# Patient Record
Sex: Female | Born: 1980 | Race: White | Hispanic: No | Marital: Married | Smoking: Never smoker
Health system: Southern US, Community
[De-identification: ages and names within clinical notes are randomized; demographics above are authoritative.]

## PROBLEM LIST (undated history)

## (undated) DIAGNOSIS — M1990 Unspecified osteoarthritis, unspecified site: Secondary | ICD-10-CM

## (undated) DIAGNOSIS — F418 Other specified anxiety disorders: Secondary | ICD-10-CM

## (undated) DIAGNOSIS — N200 Calculus of kidney: Secondary | ICD-10-CM

## (undated) DIAGNOSIS — R531 Weakness: Secondary | ICD-10-CM

## (undated) DIAGNOSIS — F988 Other specified behavioral and emotional disorders with onset usually occurring in childhood and adolescence: Secondary | ICD-10-CM

## (undated) DIAGNOSIS — E890 Postprocedural hypothyroidism: Secondary | ICD-10-CM

## (undated) DIAGNOSIS — N83209 Unspecified ovarian cyst, unspecified side: Secondary | ICD-10-CM

## (undated) DIAGNOSIS — F419 Anxiety disorder, unspecified: Secondary | ICD-10-CM

## (undated) DIAGNOSIS — C73 Malignant neoplasm of thyroid gland: Secondary | ICD-10-CM

## (undated) HISTORY — PX: AUGMENTATION MAMMAPLASTY: SUR837

## (undated) HISTORY — DX: Unspecified osteoarthritis, unspecified site: M19.90

## (undated) HISTORY — DX: Other specified behavioral and emotional disorders with onset usually occurring in childhood and adolescence: F98.8

## (undated) HISTORY — DX: Postprocedural hypothyroidism: E89.0

## (undated) HISTORY — PX: TOTAL THYROIDECTOMY: SHX2547

## (undated) HISTORY — DX: Calculus of kidney: N20.0

## (undated) HISTORY — PX: REDUCTION MAMMAPLASTY: SUR839

## (undated) HISTORY — DX: Weakness: R53.1

## (undated) HISTORY — DX: Unspecified ovarian cyst, unspecified side: N83.209

## (undated) HISTORY — PX: BREAST BIOPSY: SHX20

## (undated) HISTORY — DX: Malignant neoplasm of thyroid gland: C73

## (undated) HISTORY — DX: Other specified anxiety disorders: F41.8

---
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
## 2009-05-29 HISTORY — PX: UMBILICAL HERNIA REPAIR: SHX196

## 2011-02-21 DIAGNOSIS — O09899 Supervision of other high risk pregnancies, unspecified trimester: Secondary | ICD-10-CM

## 2011-02-21 HISTORY — DX: Supervision of other high risk pregnancies, unspecified trimester: O09.899

## 2011-03-31 DIAGNOSIS — F419 Anxiety disorder, unspecified: Secondary | ICD-10-CM | POA: Insufficient documentation

## 2014-12-30 DIAGNOSIS — R531 Weakness: Secondary | ICD-10-CM | POA: Insufficient documentation

## 2016-05-29 HISTORY — PX: BREAST ENHANCEMENT SURGERY: SHX7

## 2016-09-14 DIAGNOSIS — F988 Other specified behavioral and emotional disorders with onset usually occurring in childhood and adolescence: Secondary | ICD-10-CM | POA: Insufficient documentation

## 2017-05-29 DIAGNOSIS — G500 Trigeminal neuralgia: Secondary | ICD-10-CM

## 2017-05-29 HISTORY — DX: Trigeminal neuralgia: G50.0

## 2017-06-22 DIAGNOSIS — R200 Anesthesia of skin: Secondary | ICD-10-CM | POA: Insufficient documentation

## 2017-06-22 DIAGNOSIS — K219 Gastro-esophageal reflux disease without esophagitis: Secondary | ICD-10-CM | POA: Insufficient documentation

## 2017-06-27 DIAGNOSIS — M792 Neuralgia and neuritis, unspecified: Secondary | ICD-10-CM | POA: Insufficient documentation

## 2018-05-29 HISTORY — PX: APPENDECTOMY: SHX54

## 2018-06-06 LAB — TSH: TSH: 0.05 — AB (ref <=5.90)

## 2020-01-08 LAB — HM PAP SMEAR: HM Pap smear: NORMAL

## 2020-01-31 ENCOUNTER — Ambulatory Visit (HOSPITAL_COMMUNITY): Payer: Self-pay

## 2020-03-30 ENCOUNTER — Other Ambulatory Visit: Payer: Self-pay | Admitting: Family Medicine

## 2020-03-30 ENCOUNTER — Encounter: Payer: Self-pay | Admitting: Family Medicine

## 2020-03-30 ENCOUNTER — Other Ambulatory Visit: Payer: Self-pay

## 2020-03-30 ENCOUNTER — Ambulatory Visit (INDEPENDENT_AMBULATORY_CARE_PROVIDER_SITE_OTHER): Payer: 59 | Admitting: Family Medicine

## 2020-03-30 VITALS — BP 117/81 | HR 78 | Temp 98.0°F | Ht 61.0 in | Wt 139.0 lb

## 2020-03-30 DIAGNOSIS — Z23 Encounter for immunization: Secondary | ICD-10-CM | POA: Diagnosis not present

## 2020-03-30 DIAGNOSIS — F418 Other specified anxiety disorders: Secondary | ICD-10-CM | POA: Insufficient documentation

## 2020-03-30 DIAGNOSIS — C73 Malignant neoplasm of thyroid gland: Secondary | ICD-10-CM

## 2020-03-30 DIAGNOSIS — N200 Calculus of kidney: Secondary | ICD-10-CM

## 2020-03-30 DIAGNOSIS — E559 Vitamin D deficiency, unspecified: Secondary | ICD-10-CM | POA: Diagnosis not present

## 2020-03-30 DIAGNOSIS — M1990 Unspecified osteoarthritis, unspecified site: Secondary | ICD-10-CM

## 2020-03-30 DIAGNOSIS — R160 Hepatomegaly, not elsewhere classified: Secondary | ICD-10-CM

## 2020-03-30 DIAGNOSIS — Z79899 Other long term (current) drug therapy: Secondary | ICD-10-CM | POA: Diagnosis not present

## 2020-03-30 DIAGNOSIS — E890 Postprocedural hypothyroidism: Secondary | ICD-10-CM | POA: Diagnosis not present

## 2020-03-30 DIAGNOSIS — R109 Unspecified abdominal pain: Secondary | ICD-10-CM

## 2020-03-30 DIAGNOSIS — N83201 Unspecified ovarian cyst, right side: Secondary | ICD-10-CM

## 2020-03-30 DIAGNOSIS — Z0000 Encounter for general adult medical examination without abnormal findings: Secondary | ICD-10-CM

## 2020-03-30 HISTORY — DX: Calculus of kidney: N20.0

## 2020-03-30 LAB — COMPREHENSIVE METABOLIC PANEL
ALT: 17 U/L (ref 0–35)
AST: 20 U/L (ref 0–37)
Albumin: 4.1 g/dL (ref 3.5–5.2)
Alkaline Phosphatase: 64 U/L (ref 39–117)
BUN: 9 mg/dL (ref 6–23)
CO2: 29 mEq/L (ref 19–32)
Calcium: 9.1 mg/dL (ref 8.4–10.5)
Chloride: 100 mEq/L (ref 96–112)
Creatinine, Ser: 0.54 mg/dL (ref 0.40–1.20)
GFR: 116.38 mL/min (ref >60.00)
Glucose, Bld: 80 mg/dL (ref 70–99)
Potassium: 4.1 mEq/L (ref 3.5–5.1)
Sodium: 136 mEq/L (ref 135–145)
Total Bilirubin: 0.7 mg/dL (ref 0.2–1.2)
Total Protein: 6.8 g/dL (ref 6.0–8.3)

## 2020-03-30 LAB — CBC
HCT: 34.7 % — ABNORMAL LOW (ref 36.0–46.0)
Hemoglobin: 11.6 g/dL — ABNORMAL LOW (ref 12.0–15.0)
MCHC: 33.5 g/dL (ref 30.0–36.0)
MCV: 83.7 fl (ref 78.0–100.0)
Platelets: 416 10*3/uL — ABNORMAL HIGH (ref 150.0–400.0)
RBC: 4.14 Mil/uL (ref 3.87–5.11)
RDW: 14.4 % (ref 11.5–15.5)
WBC: 6.4 10*3/uL (ref 4.0–10.5)

## 2020-03-30 LAB — LIPID PANEL
Cholesterol: 214 mg/dL — ABNORMAL HIGH (ref 0–200)
HDL: 67.7 mg/dL (ref >39.00)
LDL Cholesterol: 124 mg/dL — ABNORMAL HIGH (ref 0–99)
NonHDL: 146.18
Total CHOL/HDL Ratio: 3
Triglycerides: 111 mg/dL (ref 0.0–149.0)
VLDL: 22.2 mg/dL (ref 0.0–40.0)

## 2020-03-30 LAB — VITAMIN D 25 HYDROXY (VIT D DEFICIENCY, FRACTURES): VITD: 24.71 ng/mL — ABNORMAL LOW (ref 30.00–100.00)

## 2020-03-30 LAB — T4, FREE: Free T4: 0.76 ng/dL (ref 0.60–1.60)

## 2020-03-30 LAB — T3, FREE: T3, Free: 3.4 pg/mL (ref 2.3–4.2)

## 2020-03-30 LAB — TSH: TSH: 2.01 u[IU]/mL (ref 0.35–4.50)

## 2020-03-30 MED ORDER — DULOXETINE HCL 60 MG PO CPEP
60.0000 mg | ORAL_CAPSULE | Freq: Every day | ORAL | 1 refills | Status: DC
Start: 2020-03-30 — End: 2020-10-04

## 2020-03-30 NOTE — Patient Instructions (Signed)
Pleasure to meet you today.  We will call you with lab results once available.   I will order that [place] after I we get the CT image to compare.    Please help [place] help you:  We are honored you have chosen [facility] for your [facility] home. Below you will find basic instructions that you may need to access in the future. Please help [place] help you by reading the instructions, which cover many of the frequent questions we experience.   Prescription refills and request:  -In order to allow more efficient response time, please call your pharmacy for all refills. They will forward the request electronically to [place]. This allows for the quickest possible response. Request left on a nurse line can take longer to refill, since these are checked as time allows between office patients and other phone calls.  - refill request can take up to 3-5 working days to complete.  - If request is sent electronically and request is appropiate, it is usually completed in 1-2 business days.  - all patients will need to be seen routinely for all chronic medical conditions requiring prescription medications (see follow-up below). If you are overdue for follow up on your condition, you will be asked to make an appointment and we will call in enough medication to cover you until your appointment (up to 30 days).  - all controlled substances will require a face to face visit to request/refill.  - if you desire your prescriptions to go through a new pharmacy, and have an active script at original pharmacy, you will need to call your pharmacy and have scripts transferred to new pharmacy. This is completed between the pharmacy locations and not by your provider.    Results: Our office handles many outgoing and incoming calls daily. If we have not contacted you within 1 week about your results, please check your mychart to see if there is a message first and if not, then contact our office.  In helping with this matter, you  help decrease call volume, and therefore allow [place] to be able to respond to patients needs more efficiently.  We will always attempt to call you with results,  normal or abnormal. However, if we are unable to reach you we will send a message in your my chart with results.   Acute office visits (sick visit):  An acute visit is intended for a new problem and are scheduled in shorter time slots to allow schedule openings for patients with new problems. This is the appropriate visit to discuss a new problem. Problems will not be addressed by phone call or Echart message. Appointment is needed if requesting treatment. In order to provide you with excellent quality medical care with proper time for you to explain your problem, have an exam and receive treatment with instructions, these appointments should be limited to one new problem per visit. If you experience a new problem, in which you desire to be addressed, please make an acute office visit, we save openings on the schedule to accommodate you. Please do not save your new problem for any other type of visit, let us take care of it properly and quickly for you.   Follow up visits:  Depending on your condition(s) your provider will need to see you routinely in order to provide you with quality care and prescribe medication(s). Most chronic conditions (Example: hypertension, Diabetes, depression/anxiety... etc), require visits a couple times a year. Your provider will instruct you  on proper follow up for your personal medical conditions and history. Please make certain to make follow up appointments for your condition as instructed. Failing to do so could result in lapse in your medication treatment/refills. If you request a refill, and are overdue to be seen on a condition, we will always provide you with a 30 day script (once) to allow you time to schedule.    Medicare wellness (well visit): - we have a wonderful Nurse Maudie Mercury), that will meet with you and  provide you will yearly medicare wellness visits. These visits should occur yearly (can not be scheduled less than 1 calendar year apart) and cover preventive health, immunizations, advance directives and screenings you are entitled to yearly through your medicare benefits. Do not miss out on your entitled benefits, this is when medicare will pay for these benefits to be ordered for you.  These are strongly encouraged by your provider and is the appropriate type of visit to make certain you are up to date with all preventive health benefits. If you have not had your medicare wellness exam in the last 12 months, please make certain to schedule one by calling the office and schedule your medicare wellness with Maudie Mercury as soon as possible.   Yearly physical (well visit):  - Adults are recommended to be seen yearly for physicals. Check with your insurance and date of your last physical, most insurances require one calendar year between physicals. Physicals include all preventive health topics, screenings, medical exam and labs that are appropriate for gender/age and history. You may have fasting labs needed at this visit. This is a well visit (not a sick visit), new problems should not be covered during this visit (see acute visit).  - Pediatric patients are seen more frequently when they are younger. Your provider will advise you on well child visit timing that is appropriate for your their age. - This is not a medicare wellness visit. Medicare wellness exams do not have an exam portion to the visit. Some medicare companies allow for a physical, some do not allow a yearly physical. If your medicare allows a yearly physical you can schedule the medicare wellness with our nurse Maudie Mercury and have your physical with your provider after, on the same day. Please check with insurance for your full benefits.   Late Policy/No Shows:  - all new patients should arrive 15-30 minutes earlier than appointment to allow [place] time  to   obtain all personal demographics,  insurance information and for you to complete office paperwork. - All established patients should arrive 10-15 minutes earlier than appointment time to update all information and be checked in .  - In our best efforts to run on time, if you are late for your appointment you will be asked to either reschedule or if able, we will work you back into the schedule. There will be a wait time to work you back in the schedule,  depending on availability.  - If you are unable to make it to your appointment as scheduled, please call 24 hours ahead of time to allow [place] to fill the time slot with someone else who needs to be seen. If you do not cancel your appointment ahead of time, you may be charged a no show fee.

## 2020-03-30 NOTE — Progress Notes (Signed)
Patient ID: Lynn Chapman, female  DOB: 08-23-1980, 39 y.o.   MRN: [id number] Patient Care Team    Relationship Specialty Notifications Start End  Ma Hillock, DO PCP - General Family Medicine  03/30/20     Chief Complaint  Patient presents with  . Establish Care    pt wanted to discuss cyst onl liver and R ovary;     Subjective:  Lynn Chapman is a 39 y.o.  female present for new patient establishment. All past medical history, surgical history, allergies, family history, immunizations, medications and social history were updated in the electronic medical record today. All recent labs, ED visits and hospitalizations within the last year were reviewed.  Depression with anxiety Patient reports she is compliant with Cymbalta 60 mg daily and feels her depression anxiety is well managed on this medication  Chronic inflammatory arthritis Patient has a history of inflammatory arthritis.  She has been on methotrexate, Plaquenil and possibly Humira in the past.  She had followed with rheumatology in the past.  She is currently not taking any medications for her arthritis by her choice.  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  She is compliant with levothyroxine 100 mcg daily.  She had followed with endocrinology in the past which were monitoring her thyroid levels and thyroid globin annually.  Per records they feel she is at low risk for recurrence of cancer.   Depression screen [facility] 2/9 03/30/2020  Decreased Interest 0  Down, Depressed, Hopeless 0  PHQ - 2 Score 0   No flowsheet data found.     No flowsheet data found.   Immunization History  Administered Date(s) Administered  . DT (Pediatric) 09/27/2015  . Hepatitis A, Adult 06/29/2016  . Influenza,inj,Quad PF,6+ Mos 03/02/2015, 03/30/2017, 04/22/2019, 03/30/2020  . Influenza-Unspecified 03/03/2011, 03/29/2018, 04/11/2018, 04/22/2019   . Moderna SARS-COVID-2 Vaccination 06/02/2019, 06/30/2019  . PPD Test 02/04/2016  . Td 01/30/2006  . Tdap 03/03/2011, 10/05/2015    No exam data present  Past Medical History:  Diagnosis Date  . ADD (attention deficit disorder)   . Chronic inflammatory arthritis   . Depression with anxiety   . Kidney stones   . Nephrolithiasis 2001  . Ovarian cyst    right  . Papillary thyroid carcinoma (HCC)    s/p surigcal hypothyroidism  . Post-surgical hypothyroidism   . Trigeminal neuralgia of right side of face 2019  . Weakness of left side of body    No Known Allergies Past Surgical History:  Procedure Laterality Date  . APPENDECTOMY  2020  . BREAST BIOPSY     2001, 2017  . BREAST ENHANCEMENT SURGERY  2018  . TOTAL THYROIDECTOMY     total thyroidectomy with central neck dissection and RAI remnant ablation; papillary thyroid carcinoma  . UMBILICAL HERNIA REPAIR  2011   Family History  Problem Relation Age of Onset  . Heart disease Mother   . Hyperlipidemia Mother   . Hypertension Mother   . Kidney disease Mother   . Hypertension Father   . Hyperlipidemia Father   . COPD Father   . Asthma Maternal Grandmother   . Prostate cancer Maternal Grandfather    Social History   Social History Narrative   Marital status/children/pets: Married [place]), 4 children.    Education/employment: college education, NP   Safety:      -smoke alarm in the home:Yes     - wears seatbelt: Yes     -  Feels safe in their relationships: Yes    Allergies as of 03/30/2020   No Known Allergies     Medication List       Accurate as of March 30, 2020 11:59 PM. If you have any questions, ask your nurse or doctor.        STOP taking these medications   Aczone 7.5 % Gel Generic drug: Dapsone Stopped by: Howard Pouch, DO   amphetamine-dextroamphetamine 10 MG tablet Commonly known as: ADDERALL Stopped by: Howard Pouch, DO   Epiduo 0.1-2.5 % gel Generic drug: Adapalene-Benzoyl  Peroxide Stopped by: Howard Pouch, DO   hydroxychloroquine 200 MG tablet Commonly known as: PLAQUENIL Stopped by: Howard Pouch, DO   Neurontin 600 MG tablet Generic drug: gabapentin Stopped by: Howard Pouch, DO   PriLOSEC OTC 20 MG tablet Generic drug: omeprazole Stopped by: Howard Pouch, DO   Vyvanse 30 MG capsule Generic drug: lisdexamfetamine Stopped by: Howard Pouch, DO     TAKE these medications   cetirizine 10 MG tablet Commonly known as: ZYRTEC Take 10 mg by mouth daily.   DULoxetine 60 MG capsule Commonly known as: CYMBALTA Take 1 capsule (60 mg total) by mouth daily.   Levothyroxine Sodium 100 MCG Caps Commonly known as: Tirosint 1 tab p.o. daily on empty stomach What changed: See the new instructions. Changed by: Howard Pouch, DO   liothyronine 5 MCG tablet Commonly known as: CYTOMEL Take 1 tablet (5 mcg total) by mouth daily.   multivitamin tablet Take 1 tablet by mouth daily.   vitamin D3 50 MCG (2000 UT) Caps Take by mouth.       All past medical history, surgical history, allergies, family history, immunizations andmedications were updated in the EMR today and reviewed under the history and medication portions of their EMR.     Patient was never admitted.   ROS: 14 pt review of systems performed and negative (unless mentioned in an HPI)  Objective: BP 117/81   Pulse 78   Temp 98 F (36.7 C) (Oral)   Ht 5\' 1"  (1.549 m)   Wt 139 lb (63 kg)   SpO2 100%   BMI 26.26 kg/m  Gen: Afebrile. No acute distress. Nontoxic in appearance, well-developed, well-nourished, thin female HENT: AT. [place].  No cough.  No hoarseness. Eyes:Pupils Equal Round Reactive to light, Extraocular movements intact,  Conjunctiva without redness, discharge or icterus. Neck/lymp/endocrine: Supple, no lymphadenopathy, no thyromegaly CV: RRR no murmur, no edema, +2/4 P posterior tibialis pulses.  Chest: CTAB, no wheeze, rhonchi or crackles.  Normal respiratory effort.  Good  air movement. Abd: Soft.  Flat. NTND. BS present.  No masses palpated. No hepatosplenomegaly. No rebound tenderness or guarding. Skin: No rashes, purpura or petechiae. Warm and well-perfused. Skin intact. Neuro/Msk:  Normal gait. PERLA. EOMi. Alert. Oriented x3.   Psych: Normal affect, dress and demeanor. Normal speech. Normal thought content and judgment.   Assessment/plan: Lynn Chapman is a 39 y.o. female present for  Encounter for medical examination to establish care Depression with anxiety Stable. Continue Cymbalta 60 mg daily  Chronic inflammatory arthritis Patient has been on Plaquenil and methotrexate in the past.  She elected to discontinue on her own.  At this time she does not want follow-up with rheumatology. 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  Patient has followed  with endocrine. Refills on levothyroxine 100 mcg daily will be provided after laboratory results received for appropriate dose if needing adjusting. - Lipid panel - TSH - T3, free - T4, free - Thyroglobulin antibody - Ambulatory referral to Endocrinology  Need for influenza vaccination Influenza vaccine administered today  Abdominal discomfort/ Liver mass, left lobe/ Cyst of right ovary CT completed 07/10/2018 revealed right ovarian cleft cyst measuring 2.3 x 2.5 x 2.2 cm and a 6 mm low-density mass in the left hepatic lobe stable since February of uncertain clinical significance.  Patient desires repeat imaging since she is having right side abdominal discomfort - US Abdomen Complete; Future   Return in about 6 months (around 09/20/2020) for [place] (30 min).  Orders Placed This Encounter  Procedures  . Flu Vaccine QUAD 6+ mos PF IM (Fluarix Quad PF)  .  Comprehensive metabolic panel  . Lipid panel  . TSH  . T3, free  . T4, free  . Thyroglobulin antibody  . Vitamin D (25 hydroxy)  . CBC  . Ambulatory referral to Endocrinology   Meds ordered this encounter  Medications  . DULoxetine (CYMBALTA) 60 MG capsule    Sig: Take 1 capsule (60 mg total) by mouth daily.    Dispense:  90 capsule    Refill:  1  . Levothyroxine Sodium (TIROSINT) 100 MCG CAPS    Sig: 1 tab p.o. daily on empty stomach    Dispense:  90 capsule    Refill:  3  . liothyronine (CYTOMEL) 5 MCG tablet    Sig: Take 1 tablet (5 mcg total) by mouth daily.    Dispense:  90 tablet    Refill:  3    Referral Orders     Ambulatory referral to Endocrinology   Note is dictated utilizing voice recognition software. Although note has been proof read prior to signing, occasional typographical errors still can be missed. If any questions arise, please do not hesitate to call for verification.  Electronically signed by: Howard Pouch, DO [place]

## 2020-03-31 ENCOUNTER — Encounter: Payer: Self-pay | Admitting: Family Medicine

## 2020-03-31 LAB — THYROGLOBULIN ANTIBODY: Thyroglobulin Ab: <1 IU/mL (ref <=1)

## 2020-03-31 MED ORDER — LEVOTHYROXINE SODIUM 100 MCG PO CAPS
ORAL_CAPSULE | ORAL | 3 refills | Status: DC
Start: 2020-03-31 — End: 2020-11-10

## 2020-03-31 MED ORDER — LIOTHYRONINE SODIUM 5 MCG PO TABS
5.0000 ug | ORAL_TABLET | Freq: Every day | ORAL | 3 refills | Status: DC
Start: 2020-03-31 — End: 2021-04-12

## 2020-03-31 NOTE — Progress Notes (Signed)
LVM for pt to call (lab results) and sent MyChart message

## 2020-04-05 ENCOUNTER — Encounter: Payer: Self-pay | Admitting: Family Medicine

## 2020-04-05 ENCOUNTER — Telehealth: Payer: Self-pay | Admitting: Family Medicine

## 2020-04-05 DIAGNOSIS — R160 Hepatomegaly, not elsewhere classified: Secondary | ICD-10-CM | POA: Insufficient documentation

## 2020-04-05 DIAGNOSIS — N83201 Unspecified ovarian cyst, right side: Secondary | ICD-10-CM | POA: Insufficient documentation

## 2020-04-05 NOTE — Telephone Encounter (Signed)
LV for pt to CB

## 2020-04-05 NOTE — Telephone Encounter (Signed)
LVM for pt CB

## 2020-04-05 NOTE — Telephone Encounter (Signed)
Please inform patient that I have placed the order for the ultrasound of her abdomen as we discussed.  They should be reaching out to get her scheduled and we will call her with the results once available.

## 2020-04-05 NOTE — Telephone Encounter (Signed)
See pt advice request. 

## 2020-04-05 NOTE — Telephone Encounter (Signed)
Kuneff, Renee A, DO 8 hours ago (7:20 AM)     Please inform patient that I have placed the order for the ultrasound of her abdomen as we discussed.  They should be reaching out to get her scheduled and we will call her with the results once available.

## 2020-04-07 ENCOUNTER — Encounter: Payer: Self-pay | Admitting: Family Medicine

## 2020-04-07 NOTE — Telephone Encounter (Signed)
Spoke with pt regarding labs and instructions.   

## 2020-04-19 ENCOUNTER — Other Ambulatory Visit: Payer: 59

## 2020-05-04 ENCOUNTER — Ambulatory Visit: Payer: 59 | Admitting: Family Medicine

## 2020-05-04 ENCOUNTER — Ambulatory Visit: Payer: 59

## 2020-05-05 ENCOUNTER — Other Ambulatory Visit: Payer: Self-pay

## 2020-05-05 ENCOUNTER — Ambulatory Visit (INDEPENDENT_AMBULATORY_CARE_PROVIDER_SITE_OTHER): Payer: 59

## 2020-05-05 DIAGNOSIS — D649 Anemia, unspecified: Secondary | ICD-10-CM | POA: Diagnosis not present

## 2020-05-05 LAB — CBC
HCT: 33.3 % — ABNORMAL LOW (ref 36.0–46.0)
Hemoglobin: 11.1 g/dL — ABNORMAL LOW (ref 12.0–15.0)
MCHC: 33.2 g/dL (ref 30.0–36.0)
MCV: 82.4 fl (ref 78.0–100.0)
Platelets: 357 10*3/uL (ref 150.0–400.0)
RBC: 4.04 Mil/uL (ref 3.87–5.11)
RDW: 14.1 % (ref 11.5–15.5)
WBC: 5.9 10*3/uL (ref 4.0–10.5)

## 2020-05-06 ENCOUNTER — Ambulatory Visit (INDEPENDENT_AMBULATORY_CARE_PROVIDER_SITE_OTHER): Payer: 59 | Admitting: Family Medicine

## 2020-05-06 ENCOUNTER — Encounter: Payer: Self-pay | Admitting: Family Medicine

## 2020-05-06 VITALS — BP 108/71 | HR 91 | Temp 98.4°F | Ht 63.0 in | Wt 140.0 lb

## 2020-05-06 DIAGNOSIS — D509 Iron deficiency anemia, unspecified: Secondary | ICD-10-CM

## 2020-05-06 DIAGNOSIS — R599 Enlarged lymph nodes, unspecified: Secondary | ICD-10-CM

## 2020-05-06 LAB — IRON,TIBC AND FERRITIN PANEL
%SAT: 14 % (calc) — ABNORMAL LOW (ref 16–45)
Ferritin: 5 ng/mL — ABNORMAL LOW (ref 16–154)
Iron: 52 ug/dL (ref 40–190)
TIBC: 371 mcg/dL (calc) (ref 250–450)

## 2020-05-06 MED ORDER — POLYSACCHARIDE IRON COMPLEX 150 MG PO CAPS: ORAL_CAPSULE | ORAL | 0 refills | Status: DC

## 2020-05-06 NOTE — Progress Notes (Signed)
This visit occurred during the SARS-CoV-2 public health emergency.  Safety protocols were in place, including screening questions prior to the visit, additional usage of staff PPE, and extensive cleaning of exam room while observing appropriate contact time as indicated for disinfecting solutions.    Lynn Chapman , Dec 17, 1980, 39 y.o., female MRN: [id number] Patient Care Team    Relationship Specialty Notifications Start End  Ma Hillock, DO PCP - General Family Medicine  03/30/20     Chief Complaint  Patient presents with  . Anemia     Subjective: Pt presents for an OV to discuss mild anemia vitamin D deficiency found on lab results at her physical 1 month ago.  Patient had complained of mild fatigue at that time.  Hemoglobin was mildly low at 11.6, hematocrit 34.7 MCV 83.7.  Rest of her labs were unremarkable with the exception of mild vitamin D deficiency at 24.  Repeat labs were collected prior to appointment today with a hemoglobin of 11.1, hematocrit 33.3, MCV 82.4, iron 52, TIBC 371, percent saturation 14, ferritin 5. Patient states that since being seen last visit she has noticed an increase in her headaches, fatigue, night sweats, palpitations, occasional dizziness when changing position.  She denies any shortness of breath.  She reports these have been occurring over the last 2 weeks.  She has a small knot behind her left ear also.  Patient reports she has had heavy menstrual cycles and clotting for many years.  She does not feel they have been any heavier than her usual.  She denies noting any melena or hematochezia.  She has not had any bowel changes.  Depression screen PHQ 2/9 03/30/2020  Decreased Interest 0  Down, Depressed, Hopeless 0  PHQ - 2 Score 0    No Known Allergies Social History   Social History Narrative   Marital status/children/pets: Married Lynn Chapman), 4 children.    Education/employment: college education, NP   Safety:      -smoke alarm in the  home:Yes     - wears seatbelt: Yes     - Feels safe in their relationships: Yes   Past Medical History:  Diagnosis Date  . ADD (attention deficit disorder)   . Chronic inflammatory arthritis   . Depression with anxiety   . Kidney stones   . Nephrolithiasis 2001  . Ovarian cyst    right  . Papillary thyroid carcinoma (HCC)    s/p surigcal hypothyroidism  . Post-surgical hypothyroidism   . Trigeminal neuralgia of right side of face 2019  . Weakness of left side of body    Past Surgical History:  Procedure Laterality Date  . APPENDECTOMY  2020  . BREAST BIOPSY     2001, 2017  . BREAST ENHANCEMENT SURGERY  2018  . TOTAL THYROIDECTOMY     total thyroidectomy with central neck dissection and RAI remnant ablation; papillary thyroid carcinoma  . UMBILICAL HERNIA REPAIR  2011   Family History  Problem Relation Age of Onset  . Heart disease Mother   . Hyperlipidemia Mother   . Hypertension Mother   . Kidney disease Mother   . Hypertension Father   . Hyperlipidemia Father   . COPD Father   . Asthma Maternal Grandmother   . Prostate cancer Maternal Grandfather    Allergies as of 05/06/2020   No Known Allergies     Medication List       Accurate as of May 06, 2020 12:51 PM. If you have any  questions, ask your nurse or doctor.        cetirizine 10 MG tablet Commonly known as: ZYRTEC Take 10 mg by mouth daily.   DULoxetine 60 MG capsule Commonly known as: CYMBALTA Take 1 capsule (60 mg total) by mouth daily.   iron polysaccharides 150 MG capsule Commonly known as: NIFEREX Take one tab Monday, Wednesday, friday Started by: Lynn Pouch, DO   Levothyroxine Sodium 100 MCG Caps Commonly known as: Tirosint 1 tab p.o. daily on empty stomach   liothyronine 5 MCG tablet Commonly known as: CYTOMEL Take 1 tablet (5 mcg total) by mouth daily.   multivitamin tablet Take 1 tablet by mouth daily.   vitamin D3 50 MCG (2000 UT) Caps Take by mouth.       All past  medical history, surgical history, allergies, family history, immunizations andmedications were updated in the EMR today and reviewed under the history and medication portions of their EMR.     ROS: Negative, with the exception of above mentioned in HPI   Objective:  BP 108/71   Pulse 91   Temp 98.4 F (36.9 C) (Oral)   Ht 5\' 3"  (1.6 m)   Wt 140 lb (63.5 kg)   SpO2 100%   BMI 24.80 kg/m  Body mass index is 24.8 kg/m. Gen: Afebrile. No acute distress. Nontoxic in appearance, well developed, well nourished.  HENT: AT. . Bilateral TM visualized and are without erythema or bulging. MMM, no oral lesions. Throat without erythema or exudates.  No cough.  No hoarseness. Eyes:Pupils Equal Round Reactive to light, Extraocular movements intact,  Conjunctiva without redness, discharge or icterus. Neck/lymp/endocrine: Supple, 2 mm very small posterior auricular lymph node on the left.   CV: RRR no murmur, no edema Chest: CTAB, no wheeze or crackles. Good air movement, normal resp effort.  Skin: Abrasion/excoriation just anterior to left ear and along hairline.  No scalp lesions noted. Neuro: Normal gait. PERLA. EOMi. Alert. Oriented x3 . Psych: Normal affect, dress and demeanor. Normal speech. Normal thought content and judgment.  No exam data present No results found.  Assessment/Plan: Syrina Wake is a 39 y.o. female present for OV for  Iron deficiency anemia, unspecified iron deficiency anemia type Possibly related to her heavy menstrual cycles.  If FOBT cards are positive will refer to GI.  If FOBT is negative would encourage her to discuss with her gynecologist medications and/or procedures that can help lighten her menstrual cycles. Start iron polysaccharide 150 mg Monday Wednesday Friday - Hemoccult Cards (X3 cards); Future> if positive would refer to GI Follow-up 3 months  Lymph node enlargement Small posterior auricular lymph node.  Possibly related to skin excoriations near ear.   Elected to monitor closely over next few weeks to ensure complete resolution. Patient understands if still present in approximately 4 weeks or has not improved she should follow-up to be seen   Reviewed expectations re: course of current medical issues.  Discussed self-management of symptoms.  Outlined signs and symptoms indicating need for more acute intervention.  Patient verbalized understanding and all questions were answered.  Patient received an After-Visit Summary.    Orders Placed This Encounter  Procedures  . Hemoccult Cards (X3 cards)   Meds ordered this encounter  Medications  . iron polysaccharides (NIFEREX) 150 MG capsule    Sig: Take one tab Monday, Wednesday, friday    Dispense:  36 capsule    Refill:  0   Referral Orders  No referral(s) requested today  Note is dictated utilizing voice recognition software. Although note has been proof read prior to signing, occasional typographical errors still can be missed. If any questions arise, please do not hesitate to call for verification.   electronically signed by:  Lynn Pouch, DO  [place]

## 2020-05-06 NOTE — Patient Instructions (Signed)
If prescribed iron supplement is too expensive- you can look on amazon also iron polysaccharide 150. (on MWF)  Or OTC ferrous sulfate 325 mg once daily. This one causes more GI side effects    Iron-Rich Diet  Iron is a mineral that helps your body to produce hemoglobin. Hemoglobin is a protein in red blood cells that carries oxygen to your body's tissues. Eating too little iron may cause you to feel weak and tired, and it can increase your risk of infection. Iron is naturally found in many foods, and many foods have iron added to them (iron-fortified foods). You may need to follow an iron-rich diet if you do not have enough iron in your body due to certain medical conditions. The amount of iron that you need each day depends on your age, your sex, and any medical conditions you have. Follow instructions from your health care provider or a diet and nutrition specialist (dietitian) about how much iron you should eat each day. What are tips for following this plan? Reading food labels  Check food labels to see how many milligrams (mg) of iron are in each serving. Cooking  Cook foods in pots and pans that are made from iron.  Take these steps to make it easier for your body to absorb iron from certain foods: ? Soak beans overnight before cooking. ? Soak whole grains overnight and drain them before using. ? Ferment flours before baking, such as by using yeast in bread dough. Meal planning  When you eat foods that contain iron, you should eat them with foods that are high in vitamin C. These include oranges, peppers, tomatoes, potatoes, and mango. Vitamin C helps your body to absorb iron. General information  Take iron supplements only as told by your health care provider. An overdose of iron can be life-threatening. If you were prescribed iron supplements, take them with orange juice or a vitamin C supplement.  When you eat iron-fortified foods or take an iron supplement, you should also eat  foods that naturally contain iron, such as meat, poultry, and fish. Eating naturally iron-rich foods helps your body to absorb the iron that is added to other foods or contained in a supplement.  Certain foods and drinks prevent your body from absorbing iron properly. Avoid eating these foods in the same meal as iron-rich foods or with iron supplements. These foods include: ? Coffee, black tea, and red wine. ? Milk, dairy products, and foods that are high in calcium. ? Beans and soybeans. ? Whole grains. What foods should I eat? Fruits Prunes. Raisins. Eat fruits high in vitamin C, such as oranges, grapefruits, and strawberries, alongside iron-rich foods. Vegetables Spinach (cooked). Green peas. Broccoli. Fermented vegetables. Eat vegetables high in vitamin C, such as leafy greens, potatoes, bell peppers, and tomatoes, alongside iron-rich foods. Grains Iron-fortified breakfast cereal. Iron-fortified whole-wheat bread. Enriched rice. Sprouted grains. Meats and other proteins Beef liver. Oysters. Beef. Shrimp. [place]. Chicken. [place]. Sardines. Chickpeas. Nuts. Tofu. Pumpkin seeds. Beverages Tomato juice. Fresh orange juice. Prune juice. Hibiscus tea. Fortified instant breakfast shakes. Sweets and desserts Blackstrap molasses. Seasonings and condiments Tahini. Fermented soy sauce. Other foods Wheat germ. The items listed above may not be a complete list of recommended foods and beverages. Contact a dietitian for more information. What foods should I avoid? Grains Whole grains. Bran cereal. Bran flour. Oats. Meats and other proteins Soybeans. Products made from soy protein. Black beans. Lentils. Mung beans. Split peas. Dairy Milk. Cream. Cheese. Yogurt. Cottage cheese.  Beverages Coffee. Black tea. Red wine. Sweets and desserts Cocoa. Chocolate. Ice cream. Other foods Basil. Oregano. Large amounts of parsley. The items listed above may not be a complete list of foods and beverages  to avoid. Contact a dietitian for more information. Summary  Iron is a mineral that helps your body to produce hemoglobin. Hemoglobin is a protein in red blood cells that carries oxygen to your body's tissues.  Iron is naturally found in many foods, and many foods have iron added to them (iron-fortified foods).  When you eat foods that contain iron, you should eat them with foods that are high in vitamin C. Vitamin C helps your body to absorb iron.  Certain foods and drinks prevent your body from absorbing iron properly, such as whole grains and dairy products. You should avoid eating these foods in the same meal as iron-rich foods or with iron supplements. This information is not intended to replace advice given to you by your health care provider. Make sure you discuss any questions you have with your health care provider. Document Revised: 04/27/2017 Document Reviewed: 04/10/2017 Elsevier Patient Education  [place].  Iron Deficiency Anemia, Adult Iron-deficiency anemia is when you have a low amount of red blood cells or hemoglobin. This happens because you have too little iron in your body. Hemoglobin carries oxygen to parts of the body. Anemia can cause your body to not get enough oxygen. It may or may not cause symptoms. Follow these instructions at home: Medicines  Take over-the-counter and prescription medicines only as told by your doctor. This includes iron pills (supplements) and vitamins.  If you cannot handle taking iron pills by mouth, ask your doctor about getting iron through: ? A vein (intravenously). ? A shot (injection) into a muscle.  Take iron pills when your stomach is empty. If you cannot handle this, take them with food.  Do not drink milk or take antacids at the same time as your iron pills.  To prevent trouble pooping (constipation), eat fiber or take medicine (stool softener) as told by your doctor. Eating and drinking   Talk with your doctor  before changing the foods you eat. He or she may tell you to eat foods that have a lot of iron, such as: ? Liver. ? Lowfat (lean) beef. ? Breads and cereals that have iron added to them (fortified breads and cereals). ? Eggs. ? Dried fruit. ? Dark green, leafy vegetables.  Drink enough fluid to keep your pee (urine) clear or pale yellow.  Eat fresh fruits and vegetables that are high in vitamin C. They help your body to use iron. Foods with a lot of vitamin C include: ? Oranges. ? Peppers. ? Tomatoes. ? Mangoes. General instructions  Return to your normal activities as told by your doctor. Ask your doctor what activities are safe for you.  Keep yourself clean, and keep things clean around you (your surroundings). Anemia can make you get sick more easily.  Keep all follow-up visits as told by your doctor. This is important. Contact a doctor if:  You feel sick to your stomach (nauseous).  You throw up (vomit).  You feel weak.  You are sweating for no clear reason.  You have trouble pooping, such as: ? Pooping (having a bowel movement) less than 3 times a week. ? Straining to poop. ? Having poop that is hard, dry, or larger than normal. ? Feeling full or bloated. ? Pain in the lower belly. ? Not feeling  better after pooping. Get help right away if:  You pass out (faint). If this happens, do not drive yourself to the hospital. Call your local emergency services (911 in the [place]  You have chest pain.  You have shortness of breath that: ? Is very bad. ? Gets worse with physical activity.  You have a fast heartbeat.  You get light-headed when getting up from sitting or lying down. This information is not intended to replace advice given to you by your health care provider. Make sure you discuss any questions you have with your health care provider. Document Revised: 04/27/2017 Document Reviewed: 02/02/2016 Elsevier Patient Education  [place].

## 2020-05-07 ENCOUNTER — Telehealth: Payer: Self-pay | Admitting: Family Medicine

## 2020-05-07 DIAGNOSIS — N83201 Unspecified ovarian cyst, right side: Secondary | ICD-10-CM

## 2020-05-07 NOTE — Addendum Note (Signed)
Addended by: Kavin Leech on: 05/07/2020 04:50 PM   Modules accepted: Orders

## 2020-05-07 NOTE — Telephone Encounter (Signed)
Please advise them we need to also visualize the ovaries. If additional order is needed then place the order they suggest (such as : pelvic US etc)  Thanks

## 2020-05-07 NOTE — Telephone Encounter (Signed)
Attempted to call number below but didn't have ext

## 2020-05-07 NOTE — Telephone Encounter (Signed)
Received call from [place] with [facility] regarding abdominal US ordered including diagnosis of right ovarian cyst. Per [facility], that [place] will not include the pelvic area. Please remove that diagnosis or order additional testing. Please call [facility] to advise, [phone].

## 2020-05-07 NOTE — Telephone Encounter (Signed)
Spoke with office and ordered new imaging

## 2020-05-07 NOTE — Telephone Encounter (Signed)
Please advise on dx or imaging

## 2020-05-11 ENCOUNTER — Other Ambulatory Visit: Payer: 59

## 2020-05-11 DIAGNOSIS — D509 Iron deficiency anemia, unspecified: Secondary | ICD-10-CM

## 2020-05-11 LAB — HEMOCCULT SLIDES (X 3 CARDS)
Fecal Occult Blood: NEGATIVE
OCCULT 1: NEGATIVE
OCCULT 2: NEGATIVE
OCCULT 3: NEGATIVE
OCCULT 4: NEGATIVE
OCCULT 5: NEGATIVE

## 2020-05-12 ENCOUNTER — Other Ambulatory Visit: Payer: 59

## 2020-05-13 ENCOUNTER — Encounter: Payer: Self-pay | Admitting: Family Medicine

## 2020-05-13 DIAGNOSIS — Z01419 Encounter for gynecological examination (general) (routine) without abnormal findings: Secondary | ICD-10-CM

## 2020-05-14 NOTE — Telephone Encounter (Signed)
Referral placed.  Please make patient aware

## 2020-05-14 NOTE — Telephone Encounter (Signed)
Please advise of message below. 

## 2020-05-24 ENCOUNTER — Encounter: Payer: Self-pay | Admitting: Internal Medicine

## 2020-06-01 ENCOUNTER — Other Ambulatory Visit: Payer: 59

## 2020-06-17 ENCOUNTER — Other Ambulatory Visit: Payer: 59

## 2020-06-28 ENCOUNTER — Other Ambulatory Visit: Payer: Self-pay | Admitting: Family Medicine

## 2020-07-02 ENCOUNTER — Other Ambulatory Visit: Payer: 59

## 2020-07-05 ENCOUNTER — Other Ambulatory Visit: Payer: 59

## 2020-08-02 ENCOUNTER — Ambulatory Visit: Payer: 59 | Admitting: Internal Medicine

## 2020-08-02 NOTE — Progress Notes (Deleted)
Name: Lynn Chapman  MRN/ DOB: [id number], 12/03/1980    Age/ Sex: 40 y.o., female    PCP: Ma Hillock, DO   Reason for Endocrinology Evaluation: Hx pTC     Date of Initial Endocrinology Evaluation: 08/02/2020     HPI: Ms. Lynn Chapman is a 40 y.o. female with a past medical history of Hx of PTC, trigeminal neuralgia and ADD. The patient presented for initial endocrinology clinic visit on 08/02/2020 for consultative assistance with her Hxc of PTC.   She is S/P total thyroidectomy, with central neck dissection and RAI remnant ablation     Tirosint 100 mcg  Liothyronine 5 mcg   HISTORY:  Past Medical History:  Past Medical History:  Diagnosis Date  . ADD (attention deficit disorder)   . Chronic inflammatory arthritis   . Depression with anxiety   . Kidney stones   . Nephrolithiasis 2001  . Ovarian cyst    right  . Papillary thyroid carcinoma (HCC)    s/p surigcal hypothyroidism  . Post-surgical hypothyroidism   . Trigeminal neuralgia of right side of face 2019  . Weakness of left side of body     Past Surgical History:  Past Surgical History:  Procedure Laterality Date  . APPENDECTOMY  2020  . BREAST BIOPSY     2001, 2017  . BREAST ENHANCEMENT SURGERY  2018  . TOTAL THYROIDECTOMY     total thyroidectomy with central neck dissection and RAI remnant ablation; papillary thyroid carcinoma  . UMBILICAL HERNIA REPAIR  2011      Social History:  reports that she has never smoked. She has never used smokeless tobacco. She reports previous alcohol use. She reports that she does not use drugs.  Family History: family history includes Asthma in her maternal grandmother; COPD in her father; Heart disease in her mother; Hyperlipidemia in her father and mother; Hypertension in her father and mother; Kidney disease in her mother; Prostate cancer in her maternal grandfather.   HOME MEDICATIONS: Allergies as of 08/02/2020   No Known Allergies     Medication List        Accurate as of August 02, 2020  7:27 AM. If you have any questions, ask your nurse or doctor.        cetirizine 10 MG tablet Commonly known as: ZYRTEC Take 10 mg by mouth daily.   DULoxetine 60 MG capsule Commonly known as: CYMBALTA Take 1 capsule (60 mg total) by mouth daily.   iron polysaccharides 150 MG capsule Commonly known as: NIFEREX TAKE ONE CAPSULE BY MOUTH MONDAY, WEDNESDAY, FRIDAY   Levothyroxine Sodium 100 MCG Caps Commonly known as: Tirosint 1 tab p.o. daily on empty stomach   liothyronine 5 MCG tablet Commonly known as: CYTOMEL Take 1 tablet (5 mcg total) by mouth daily.   multivitamin tablet Take 1 tablet by mouth daily.   vitamin D3 50 MCG (2000 UT) Caps Take by mouth.         REVIEW OF SYSTEMS: A comprehensive ROS was conducted with the patient and is negative except as per HPI and below:  ROS     OBJECTIVE:  VS: There were no vitals taken for this visit.   Wt Readings from Last 3 Encounters:  05/06/20 140 lb (63.5 kg)  03/30/20 139 lb (63 kg)     EXAM: General: Pt appears well and is in NAD  Hydration: Well-hydrated with moist mucous membranes and good skin turgor  Eyes: External eye exam normal without stare,  lid lag or exophthalmos.  EOM intact.  PERRL.  Ears, Nose, Throat: Hearing: Grossly intact bilaterally Dental: Good dentition  Throat: Clear without mass, erythema or exudate  Neck: General: Supple without adenopathy. Thyroid: Thyroid size normal.  No goiter or nodules appreciated. No thyroid bruit.  Lungs: Clear with good BS bilat with no rales, rhonchi, or wheezes  Heart: Auscultation: RRR.  Abdomen: Normoactive bowel sounds, soft, nontender, without masses or organomegaly palpable  Extremities: Gait and station: Normal gait  Digits and nails: No clubbing, cyanosis, petechiae, or nodes Head and neck: Normal alignment and mobility BL UE: Normal ROM and strength. BL LE: No pretibial edema normal ROM and strength.  Skin: Hair:  Texture and amount normal with gender appropriate distribution Skin Inspection: No rashes, acanthosis nigricans/skin tags. No lipohypertrophy Skin Palpation: Skin temperature, texture, and thickness normal to palpation  Neuro: Cranial nerves: II - XII grossly intact  Cerebellar: Normal coordination and movement; no tremor Motor: Normal strength throughout DTRs: 2+ and symmetric in UE without delay in relaxation phase  Mental Status: Judgment, insight: Intact Orientation: Oriented to time, place, and person Memory: Intact for recent and remote events Mood and affect: No depression, anxiety, or agitation     DATA REVIEWED: ***    ASSESSMENT/PLAN/RECOMMENDATIONS:   1. Hx of PTC:      2. Postoperative Hypothyroidism :  Medications :  Signed electronically by: Mack Guise, MD  [facility]  [facility] [address] [place], [place] [address] Phone: [phone] FAX: [phone]   CC: Ma Hillock, DO [address] [place] [address] Phone: [phone] Fax: [phone]   Return to Endocrinology clinic as below: Future Appointments  Date Time Provider [place]  08/02/2020  8:50 AM Ishmel Acevedo, Melanie Crazier, MD [facility] None  08/04/2020  8:00 AM Kuneff, Renee A, DO [facility]

## 2020-08-04 ENCOUNTER — Ambulatory Visit (INDEPENDENT_AMBULATORY_CARE_PROVIDER_SITE_OTHER): Payer: 59 | Admitting: Family Medicine

## 2020-08-04 ENCOUNTER — Encounter: Payer: Self-pay | Admitting: Family Medicine

## 2020-08-04 ENCOUNTER — Other Ambulatory Visit: Payer: Self-pay

## 2020-08-04 VITALS — BP 110/76 | HR 95 | Temp 98.5°F | Ht 63.0 in | Wt 146.0 lb

## 2020-08-04 DIAGNOSIS — R635 Abnormal weight gain: Secondary | ICD-10-CM

## 2020-08-04 DIAGNOSIS — E890 Postprocedural hypothyroidism: Secondary | ICD-10-CM | POA: Diagnosis not present

## 2020-08-04 DIAGNOSIS — E559 Vitamin D deficiency, unspecified: Secondary | ICD-10-CM

## 2020-08-04 DIAGNOSIS — D509 Iron deficiency anemia, unspecified: Secondary | ICD-10-CM | POA: Diagnosis not present

## 2020-08-04 DIAGNOSIS — C73 Malignant neoplasm of thyroid gland: Secondary | ICD-10-CM

## 2020-08-04 LAB — CBC
HCT: 39.1 % (ref 36.0–46.0)
Hemoglobin: 13.1 g/dL (ref 12.0–15.0)
MCHC: 33.4 g/dL (ref 30.0–36.0)
MCV: 83.4 fl (ref 78.0–100.0)
Platelets: 424 10*3/uL — ABNORMAL HIGH (ref 150.0–400.0)
RBC: 4.69 Mil/uL (ref 3.87–5.11)
RDW: 16.2 % — ABNORMAL HIGH (ref 11.5–15.5)
WBC: 6.2 10*3/uL (ref 4.0–10.5)

## 2020-08-04 LAB — VITAMIN D 25 HYDROXY (VIT D DEFICIENCY, FRACTURES): VITD: 33.5 ng/mL (ref 30.00–100.00)

## 2020-08-04 LAB — T3, FREE: T3, Free: 3.1 pg/mL (ref 2.3–4.2)

## 2020-08-04 LAB — T4, FREE: Free T4: 0.79 ng/dL (ref 0.60–1.60)

## 2020-08-04 LAB — TSH: TSH: 0.27 u[IU]/mL — ABNORMAL LOW (ref 0.35–4.50)

## 2020-08-04 NOTE — Patient Instructions (Signed)

## 2020-08-04 NOTE — Progress Notes (Signed)
This visit occurred during the SARS-CoV-2 public health emergency.  Safety protocols were in place, including screening questions prior to the visit, additional usage of staff PPE, and extensive cleaning of exam room while observing appropriate contact time as indicated for disinfecting solutions.    Lynn Chapman , 20-Dec-1980, 40 y.o., female MRN: [id number] Patient Care Team    Relationship Specialty Notifications Start End  Ma Hillock, DO PCP - General Family Medicine  03/30/20     Chief Complaint  Patient presents with  . Anemia    Pt is not fasting     Subjective: Pt presents for an OV to follow up on her IDA and vit d def. She has a sig h/o papillary thyroid carcinoma -post surg hypothyroid. She reports copliance with cytomel 5 mcg and levothy 100 mcg qd. She reports she is taking vit d 2000u daily and her Niferex as scheduled. She has had no side effects to the addition of iron. She has not felt any improvement in her fatigue since starting supplement 3 months ago. She has gained 6 lbs. She states she has not changed her diet or exercise regimen. She would like her thyroid panel tested today. She endorses continued fatigue. She denies dizziness or palpitations.   Prior note: discuss mild anemia vitamin D deficiency found on lab results at her physical 1 month ago.  Patient had complained of mild fatigue at that time.  Hemoglobin was mildly low at 11.6, hematocrit 34.7 MCV 83.7.  Rest of her labs were unremarkable with the exception of mild vitamin D deficiency at 24.  Repeat labs were collected prior to appointment today with a hemoglobin of 11.1, hematocrit 33.3, MCV 82.4, iron 52, TIBC 371, percent saturation 14, ferritin 5. Patient states that since being seen last visit she has noticed an increase in her headaches, fatigue, night sweats, palpitations, occasional dizziness when changing position.  She denies any shortness of breath.  She reports these have been occurring over  the last 2 weeks.  She has a small knot behind her left ear also.  Patient reports she has had heavy menstrual cycles and clotting for many years.  She does not feel they have been any heavier than her usual.  She denies noting any melena or hematochezia.  She has not had any bowel changes.  Depression screen [facility] 2/9 08/04/2020 03/30/2020  Decreased Interest 0 0  Down, Depressed, Hopeless 0 0  PHQ - 2 Score 0 0    No Known Allergies Social History   Social History Narrative   Marital status/children/pets: Married [place]), 4 children.    Education/employment: college education, NP   Safety:      -smoke alarm in the home:Yes     - wears seatbelt: Yes     - Feels safe in their relationships: Yes   Past Medical History:  Diagnosis Date  . ADD (attention deficit disorder)   . Chronic inflammatory arthritis   . Depression with anxiety   . History of preterm delivery, currently pregnant 02/21/2011  . Kidney stone 03/30/2020   Formatting of this note might be different from the original. removal in 2001  . Kidney stones   . Nephrolithiasis 2001  . Ovarian cyst    right  . Papillary thyroid carcinoma (HCC)    s/p surigcal hypothyroidism  . Post-surgical hypothyroidism   . Trigeminal neuralgia of right side of face 2019  . Weakness of left side of body    Past Surgical History:  Procedure Laterality Date  . APPENDECTOMY  2020  . BREAST BIOPSY     2001, 2017  . BREAST ENHANCEMENT SURGERY  2018  . TOTAL THYROIDECTOMY     total thyroidectomy with central neck dissection and RAI remnant ablation; papillary thyroid carcinoma  . UMBILICAL HERNIA REPAIR  2011   Family History  Problem Relation Age of Onset  . Heart disease Mother   . Hyperlipidemia Mother   . Hypertension Mother   . Kidney disease Mother   . Hypertension Father   . Hyperlipidemia Father   . COPD Father   . Asthma Maternal Grandmother   . Prostate cancer Maternal Grandfather    Allergies as of 08/04/2020   No Known  Allergies     Medication List       Accurate as of August 04, 2020  8:27 AM. If you have any questions, ask your nurse or doctor.        cetirizine 10 MG tablet Commonly known as: ZYRTEC Take 10 mg by mouth daily.   DULoxetine 60 MG capsule Commonly known as: CYMBALTA Take 1 capsule (60 mg total) by mouth daily.   iron polysaccharides 150 MG capsule Commonly known as: NIFEREX TAKE ONE CAPSULE BY MOUTH MONDAY, WEDNESDAY, FRIDAY   Levothyroxine Sodium 100 MCG Caps Commonly known as: Tirosint 1 tab p.o. daily on empty stomach   liothyronine 5 MCG tablet Commonly known as: CYTOMEL Take 1 tablet (5 mcg total) by mouth daily.   multivitamin tablet Take 1 tablet by mouth daily.   vitamin D3 50 MCG (2000 UT) Caps Take by mouth.       All past medical history, surgical history, allergies, family history, immunizations andmedications were updated in the EMR today and reviewed under the history and medication portions of their EMR.     ROS: Negative, with the exception of above mentioned in HPI   Objective:  BP 110/76   Pulse 95   Temp 98.5 F (36.9 C) (Oral)   Ht 5\' 3"  (1.6 m)   Wt 146 lb (66.2 kg)   SpO2 100%   BMI 25.86 kg/m  Body mass index is 25.86 kg/m. Gen: Afebrile. No acute distress. Nontoxic pleasant female.  HENT: AT. [place].  Eyes:Pupils Equal Round Reactive to light, Extraocular movements intact,  Conjunctiva without redness, discharge or icterus. CV: RRR no murmur, no edema Chest: CTAB, no wheeze or crackles Skin: no rashes, purpura or petechiae.  Neuro:  Normal gait. PERLA. EOMi. Alert. Oriented x3 Psych: Normal affect, dress and demeanor. Normal speech. Normal thought content and judgment.  No exam data present No results found.  Assessment/Plan: Kerryann Allaire is a 40 y.o. female present for OV for  Iron deficiency anemia, unspecified iron deficiency anemia type - has appt with gyn for menorrhagia.  - Continue iron polysaccharide 150 mg Monday  Wednesday Friday - Hemoccult Cards (X3 cards> negative - iron panel and cbc collected today    Vitamin D deficiency Continue vit d 2000u qd - Vitamin D (25 hydroxy)  Post-surgical hypothyroidism/ Papillary thyroid carcinoma (HCC)/Weight gain Weight gain and fatigue.  - TSH - T4, free - T3, free   Reviewed expectations re: course of current medical issues.  Discussed self-management of symptoms.  Outlined signs and symptoms indicating need for more acute intervention.  Patient verbalized understanding and all questions were answered.  Patient received an After-Visit Summary.    Orders Placed This Encounter  Procedures  . Iron, TIBC and Ferritin Panel  . CBC  .  Vitamin D (25 hydroxy)  . TSH  . T4, free  . T3, free   No orders of the defined types were placed in this encounter.  Referral Orders  No referral(s) requested today     Note is dictated utilizing voice recognition software. Although note has been proof read prior to signing, occasional typographical errors still can be missed. If any questions arise, please do not hesitate to call for verification.   electronically signed by:  Howard Pouch, DO  [place]

## 2020-08-05 ENCOUNTER — Telehealth: Payer: Self-pay | Admitting: Family Medicine

## 2020-08-05 LAB — IRON,TIBC AND FERRITIN PANEL
%SAT: 8 % (calc) — ABNORMAL LOW (ref 16–45)
Ferritin: 5 ng/mL — ABNORMAL LOW (ref 16–154)
Iron: 32 ug/dL — ABNORMAL LOW (ref 40–190)
TIBC: 382 mcg/dL (calc) (ref 250–450)

## 2020-08-05 MED ORDER — POLYSACCHARIDE IRON COMPLEX 150 MG PO CAPS: 150.0000 mg | ORAL_CAPSULE | Freq: Every day | ORAL | 2 refills | Status: AC

## 2020-08-05 MED ORDER — POLYSACCHARIDE IRON COMPLEX 150 MG PO CAPS: 150.0000 mg | ORAL_CAPSULE | Freq: Every day | ORAL | 2 refills | Status: DC

## 2020-08-05 NOTE — Telephone Encounter (Signed)
Spoke with pt regarding labs and instructions. Pt would like to continue cytomel during "crashing" at the end of the day.

## 2020-08-05 NOTE — Telephone Encounter (Signed)
Spoke with pt regarding labs and instructions.   

## 2020-08-05 NOTE — Telephone Encounter (Signed)
Please call patient: Vitamin D levels are now normal Her iron panel actually looks worse than it did prior.  Increase her iron supplementation to daily.  I have called in a new prescription for her. Her thyroid levels are over replaced.  But just by a small margin, therefore I would recommend her discontinuing the Cytomel 5 mcg tablet daily.  Continue the levothyroxine 100 mcg daily. Follow-up again in 10-12 weeks with provider to recheck iron and thyroid.  If iron levels are not improved at this time with oral iron supplementation and she still having symptoms, then we would need to get her to hematology to consider iron infusion.

## 2020-08-08 ENCOUNTER — Encounter: Payer: Self-pay | Admitting: Family Medicine

## 2020-08-08 DIAGNOSIS — D509 Iron deficiency anemia, unspecified: Secondary | ICD-10-CM

## 2020-08-09 NOTE — Telephone Encounter (Signed)
I have referred her to gastroenterology, per her request, for her pain and iron deficiency.  She reported having heavy menstrual cycles. This is the most likely cause of her iron deficiency. We had referred her to gynecology last visit, I would encourage her to discuss options to control her menstrual flow with her gyn team.  She inquired about a hematology referral. They will first require you to take an oral iron  supplement daily prior to approving a transfusion.

## 2020-08-09 NOTE — Telephone Encounter (Signed)
Her thyroid is oversupplemented therefore we have to decrease her medication in some fashion.  Since she is desiring to stay on Cytomel, then she should continue the levothyroxine 100 mcg daily x6 days a week and 1 day a week take a half a tab of levothyroxine.

## 2020-08-09 NOTE — Telephone Encounter (Signed)
LVM for pt to CB regarding referral request.  ?

## 2020-08-09 NOTE — Telephone Encounter (Signed)
LVM for pt to CB regarding rx changes

## 2020-08-10 NOTE — Telephone Encounter (Signed)
LVM for pt to CB regarding rx changes

## 2020-08-10 NOTE — Telephone Encounter (Signed)
LVM for pt to CB regarding referral request.  ?

## 2020-08-11 NOTE — Telephone Encounter (Signed)
Spoke with pt regarding rx request and provider's instructions

## 2020-08-11 NOTE — Telephone Encounter (Signed)
Spoke with pt regarding referral request and provider's instructions

## 2020-08-18 ENCOUNTER — Encounter: Payer: Self-pay | Admitting: Family Medicine

## 2020-08-18 ENCOUNTER — Ambulatory Visit
Admission: RE | Admit: 2020-08-18 | Discharge: 2020-08-18 | Disposition: A | Discharge status: Home / Self Care | Payer: 59 | Source: Ambulatory Visit | Attending: Family Medicine | Admitting: Family Medicine

## 2020-08-18 DIAGNOSIS — N83201 Unspecified ovarian cyst, right side: Secondary | ICD-10-CM

## 2020-08-18 DIAGNOSIS — R109 Unspecified abdominal pain: Secondary | ICD-10-CM

## 2020-08-18 DIAGNOSIS — R160 Hepatomegaly, not elsewhere classified: Secondary | ICD-10-CM

## 2020-08-20 ENCOUNTER — Telehealth: Payer: Self-pay | Admitting: Family Medicine

## 2020-08-20 DIAGNOSIS — K769 Liver disease, unspecified: Secondary | ICD-10-CM

## 2020-08-20 DIAGNOSIS — C73 Malignant neoplasm of thyroid gland: Secondary | ICD-10-CM

## 2020-08-20 NOTE — Telephone Encounter (Signed)
MRI of the liver placed.  Ultrasound was not diagnostic for prior liver lesion noted a 6 mm lesion in the left liver lobe.  Patient has a prior history of thyroid cancer.

## 2020-08-27 ENCOUNTER — Ambulatory Visit: Payer: 59 | Admitting: Family Medicine

## 2020-09-03 ENCOUNTER — Ambulatory Visit: Payer: 59 | Admitting: Family Medicine

## 2020-09-03 DIAGNOSIS — Z0289 Encounter for other administrative examinations: Secondary | ICD-10-CM

## 2020-09-27 ENCOUNTER — Ambulatory Visit: Payer: 59 | Admitting: Family Medicine

## 2020-09-27 DIAGNOSIS — F418 Other specified anxiety disorders: Secondary | ICD-10-CM

## 2020-10-04 ENCOUNTER — Other Ambulatory Visit: Payer: Self-pay

## 2020-10-04 MED ORDER — DULOXETINE HCL 60 MG PO CPEP: 60.0000 mg | ORAL_CAPSULE | Freq: Every day | ORAL | 1 refills | Status: DC

## 2020-10-20 ENCOUNTER — Ambulatory Visit: Payer: 59 | Admitting: Internal Medicine

## 2020-11-05 ENCOUNTER — Ambulatory Visit: Payer: 59 | Admitting: Family Medicine

## 2020-11-05 DIAGNOSIS — Z0289 Encounter for other administrative examinations: Secondary | ICD-10-CM

## 2020-11-09 ENCOUNTER — Other Ambulatory Visit: Payer: Self-pay

## 2020-11-09 ENCOUNTER — Ambulatory Visit (INDEPENDENT_AMBULATORY_CARE_PROVIDER_SITE_OTHER): Payer: 59 | Admitting: Family Medicine

## 2020-11-09 ENCOUNTER — Encounter: Payer: Self-pay | Admitting: Family Medicine

## 2020-11-09 VITALS — BP 110/77 | HR 87 | Temp 99.1°F | Ht 63.0 in | Wt 143.0 lb

## 2020-11-09 DIAGNOSIS — E890 Postprocedural hypothyroidism: Secondary | ICD-10-CM

## 2020-11-09 DIAGNOSIS — D509 Iron deficiency anemia, unspecified: Secondary | ICD-10-CM | POA: Diagnosis not present

## 2020-11-09 LAB — CBC WITH DIFFERENTIAL/PLATELET
Basophils Absolute: 0.1 10*3/uL (ref 0.0–0.1)
Basophils Relative: 0.9 % (ref 0.0–3.0)
Eosinophils Absolute: 0.1 10*3/uL (ref 0.0–0.7)
Eosinophils Relative: 1.3 % (ref 0.0–5.0)
HCT: 38.5 % (ref 36.0–46.0)
Hemoglobin: 12.9 g/dL (ref 12.0–15.0)
Lymphocytes Relative: 24.3 % (ref 12.0–46.0)
Lymphs Abs: 1.5 10*3/uL (ref 0.7–4.0)
MCHC: 33.4 g/dL (ref 30.0–36.0)
MCV: 87.2 fl (ref 78.0–100.0)
Monocytes Absolute: 0.5 10*3/uL (ref 0.1–1.0)
Monocytes Relative: 8.5 % (ref 3.0–12.0)
Neutro Abs: 3.9 10*3/uL (ref 1.4–7.7)
Neutrophils Relative %: 65 % (ref 43.0–77.0)
Platelets: 371 10*3/uL (ref 150.0–400.0)
RBC: 4.41 Mil/uL (ref 3.87–5.11)
RDW: 14.9 % (ref 11.5–15.5)
WBC: 6 10*3/uL (ref 4.0–10.5)

## 2020-11-09 LAB — TSH: TSH: 0.14 u[IU]/mL — ABNORMAL LOW (ref 0.35–4.50)

## 2020-11-09 LAB — T3, FREE: T3, Free: 2.8 pg/mL (ref 2.3–4.2)

## 2020-11-09 LAB — T4, FREE: Free T4: 0.89 ng/dL (ref 0.60–1.60)

## 2020-11-09 NOTE — Progress Notes (Signed)
This visit occurred during the SARS-CoV-2 public health emergency.  Safety protocols were in place, including screening questions prior to the visit, additional usage of staff PPE, and extensive cleaning of exam room while observing appropriate contact time as indicated for disinfecting solutions.    Lynn Chapman , 08-12-1980, 40 y.o., female MRN: [id number] Patient Care Team    Relationship Specialty Notifications Start End  Lynn Hillock, DO PCP - General Family Medicine  03/30/20     Chief Complaint  Patient presents with   iron def    Subjective: Pt presents for an OV to follow up on her IDA  & over supplemented thyroid.  She has a sig h/o papillary thyroid carcinoma -post surg hypothyroid. She reports copliance with cytomel 5 mcg and levothy 100 mcg qd x6 d and 1/2 tab x1 d (lowered from last visit).  She is tolerating iron supplement daily now, increased last visit after low levels again. She reports feeling no different than a few months ago and still fatigued. No palpations or dizziness noted.   Prior note: discuss mild anemia vitamin D deficiency found on lab results at her physical 1 month ago.  Patient had complained of mild fatigue at that time.  Hemoglobin was mildly low at 11.6, hematocrit 34.7 MCV 83.7.  Rest of her labs were unremarkable with the exception of mild vitamin D deficiency at 24.  Repeat labs were collected prior to appointment today with a hemoglobin of 11.1, hematocrit 33.3, MCV 82.4, iron 52, TIBC 371, percent saturation 14, ferritin 5. Patient states that since being seen last visit she has noticed an increase in her headaches, fatigue, night sweats, palpitations, occasional dizziness when changing position.  She denies any shortness of breath.  She reports these have been occurring over the last 2 weeks.  She has a small knot behind her left ear also.  Patient reports she has had heavy menstrual cycles and clotting for many years.  She does not feel they  have been any heavier than her usual.  She denies noting any melena or hematochezia.  She has not had any bowel changes.  Depression screen [facility] 2/9 08/04/2020 03/30/2020  Decreased Interest 0 0  Down, Depressed, Hopeless 0 0  PHQ - 2 Score 0 0    No Known Allergies Social History   Social History Narrative   Marital status/children/pets: Married Lynn Chapman), 4 children.    Education/employment: college education, NP   Safety:      -smoke alarm in the home:Yes     - wears seatbelt: Yes     - Feels safe in their relationships: Yes   Past Medical History:  Diagnosis Date   ADD (attention deficit disorder)    Chronic inflammatory arthritis    Depression with anxiety    History of preterm delivery, currently pregnant 02/21/2011   Kidney stone 03/30/2020   Formatting of this note might be different from the original. removal in 2001   Kidney stones    Nephrolithiasis 2001   Ovarian cyst    right   Papillary thyroid carcinoma ([place])    s/p surigcal hypothyroidism   Post-surgical hypothyroidism    Trigeminal neuralgia of right side of face 2019   Weakness of left side of body    Past Surgical History:  Procedure Laterality Date   APPENDECTOMY  2020   BREAST BIOPSY     2001, 2017   BREAST ENHANCEMENT SURGERY  2018   TOTAL THYROIDECTOMY  total thyroidectomy with central neck dissection and RAI remnant ablation; papillary thyroid carcinoma   UMBILICAL HERNIA REPAIR  2011   Family History  Problem Relation Age of Onset   Kidney disease Mother        stage 5   Heart disease Mother    Hyperlipidemia Mother    Hypertension Mother    Hypertension Father    Hyperlipidemia Father    COPD Father    Learning disabilities Maternal Uncle    Asthma Maternal Grandmother    Prostate cancer Maternal Grandfather    Allergies as of 11/09/2020   No Known Allergies      Medication List        Accurate as of November 09, 2020  8:16 AM. If you have any questions, ask your nurse or doctor.           cetirizine 10 MG tablet Commonly known as: ZYRTEC Take 10 mg by mouth daily.   DULoxetine 60 MG capsule Commonly known as: CYMBALTA Take 1 capsule (60 mg total) by mouth daily.   iron polysaccharides 150 MG capsule Commonly known as: NIFEREX Take 1 capsule (150 mg total) by mouth daily.   Levothyroxine Sodium 100 MCG Caps Commonly known as: Tirosint 1 tab p.o. daily on empty stomach   liothyronine 5 MCG tablet Commonly known as: CYTOMEL Take 1 tablet (5 mcg total) by mouth daily.   multivitamin tablet Take 1 tablet by mouth daily.   Rybelsus 7 MG Tabs Generic drug: Semaglutide Take 1 tablet by mouth daily.   vitamin D3 50 MCG (2000 UT) Caps Take by mouth.        All past medical history, surgical history, allergies, family history, immunizations andmedications were updated in the EMR today and reviewed under the history and medication portions of their EMR.     ROS: Negative, with the exception of above mentioned in HPI   Objective:  BP 110/77   Pulse 87   Temp 99.1 F (37.3 C) (Oral)   Ht 5\' 3"  (1.6 m)   Wt 143 lb (64.9 kg)   SpO2 99%   BMI 25.33 kg/m  Body mass index is 25.33 kg/m. Gen: Afebrile. No acute distress.  HENT: AT. [place].  Eyes:Pupils Equal Round Reactive to light, Extraocular movements intact,  Conjunctiva without redness, discharge or icterus. Neck/lymp/endocrine: Supple,no lymphadenopathy, no thyromegaly CV: RRR  Chest: CTAB, no wheeze or crackles Neuro:Normal gait. PERLA. EOMi. Alert. Oriented. X3 Psych: Normal affect, dress and demeanor. Normal speech. Normal thought content and judgment..   No results found. No results found.  Assessment/Plan: Lynn Chapman is a 40 y.o. female present for OV for  Iron deficiency anemia, unspecified iron deficiency anemia type - has appt with gyn for menorrhagia.  - Continue iron polysaccharide 150 mg 7d qg - Hemoccult Cards (X3 cards> negative - iron panel and cbc collected again today-  iron panel had been worse last visit> iron had been increased to daily since. If still low she may need to consider Heme if gyn issues are not corrected.  Vitamin D deficiency Continue vit d 2000u qd Labs UTD  Post-surgical hypothyroidism/ Papillary thyroid carcinoma ([place]) - over supplemented last lab 2 months ago. Decreased levo 100 mcg to 1 tab 6d and 1/2 tab one day- kept cytomel per pt choice.  - TSH - T4, free - T3, free  Reviewed expectations re: course of current medical issues. Discussed self-management of symptoms. Outlined signs and symptoms indicating need for more acute intervention. Patient  verbalized understanding and all questions were answered. Patient received an After-Visit Summary.    No orders of the defined types were placed in this encounter.  No orders of the defined types were placed in this encounter.  Referral Orders  No referral(s) requested today     Note is dictated utilizing voice recognition software. Although note has been proof read prior to signing, occasional typographical errors still can be missed. If any questions arise, please do not hesitate to call for verification.   electronically signed by:  Howard Pouch, DO  [place]

## 2020-11-09 NOTE — Patient Instructions (Addendum)
Great to see you today.    If labs were collected, we will inform you of lab results once received either by echart message or telephone call.   - echart message- for normal results that have been seen by the patient already.   - telephone call: abnormal results or if patient has not viewed results in their echart.   Iron Deficiency Anemia, Adult Iron deficiency anemia is when you do not have enough red blood cells or hemoglobin in your blood. This happens because you have too little iron in your body. Hemoglobin carries oxygen to parts of the body. Anemia can cause yourbody to not get enough oxygen. What are the causes? Not eating enough foods that have iron in them. The body not being able to take in iron well. Needing more iron due to pregnancy or heavy menstrual periods, for females. Cancer. Bleeding in the bowels. Many blood draws. What increases the risk? Being pregnant. Being a teenage girl going through a growth spurt. What are the signs or symptoms? Pale skin, lips, and nails. Weakness, dizziness, and getting tired easily. Headache. Feeling like you cannot breathe well when moving (shortness of breath). Cold hands and feet. Fast heartbeat or a heartbeat that is not regular. Feeling grouchy (irritable) or breathing fast. These are more common in very bad anemia. Mild anemia may not cause any symptoms. How is this treated? This condition is treated by finding out why you do not have enough iron and then getting more iron. It may include: Adding foods to your diet that have a lot of iron. Taking iron pills (supplements). If you are pregnant or breastfeeding, you may need to take extra iron. Your diet often does not provide the amount of iron that you need. Getting more vitamin C in your diet. Vitamin C helps your body take in iron. You may need to take iron pills with a glass of orange juice or vitamin C pills. Medicines to make heavy menstrual periods lighter. Surgery. You  may need blood tests to see if treatment is working. If the treatment doesnot seem to be working, you may need more tests. Follow these instructions at home: Medicines Take over-the-counter and prescription medicines only as told by your doctor. This includes iron pills and vitamins. Take iron pills when your stomach is empty. If you cannot handle this, take them with food. Do not drink milk or take antacids at the same time as your iron pills. Iron pills may turn your poop (stool)black. If you cannot handle taking iron pills by mouth, ask your doctor about getting iron through: An IV tube. A shot (injection) into a muscle. Eating and drinking  Talk with your doctor before changing the foods you eat. He or she may tell you to eat foods that have a lot of iron, such as: Liver. Low-fat (lean) beef. Breads and cereals that have iron added to them. Eggs. Dried fruit. Dark green, leafy vegetables. Eat fresh fruits and vegetables that are high in vitamin C. They help your body use iron. Foods with a lot of vitamin C include: Oranges. Peppers. Tomatoes. Mangoes. Drink enough fluid to keep your pee (urine) pale yellow.  Managing constipation If you are taking iron pills, they may cause trouble pooping (constipation). To prevent or treat trouble pooping, you may need to: Take over-the-counter or prescription medicines. Eat foods that are high in fiber. These include beans, whole grains, and fresh fruits and vegetables. Limit foods that are high in fat and sugar. These  include fried or sweet foods. General instructions Return to your normal activities as told by your doctor. Ask your doctor what activities are safe for you. Keep yourself clean, and keep things clean around you. Keep all follow-up visits as told by your doctor. This is important. Contact a doctor if: You feel like you may vomit (nauseous), or you vomit. You feel weak. You are sweating for no reason. You have trouble  pooping, such as: Pooping less than 3 times a week. Straining to poop. Having poop that is hard, dry, or larger than normal. Feeling full or bloated. Pain in the lower belly. Not feeling better after pooping. Get help right away if: You pass out (faint). You have chest pain. You have trouble breathing that: Is very bad. Gets worse with physical activity. You have a fast heartbeat, or a heartbeat that does not feel regular. You get light-headed when getting up from sitting or lying down. These symptoms may be an emergency. Do not wait to see if the symptoms will go away. Get medical help right away. Call your local emergency services (911 in the [place] Do not drive yourself to the hospital. Summary Iron deficiency anemia is when you have too little iron in your body. This condition is treated by finding out why you do not have enough iron in your body and then getting more iron. Take over-the-counter and prescription medicines only as told by your doctor. Eat fresh fruits and vegetables that are high in vitamin C. Get help right away if you cannot breathe well. This information is not intended to replace advice given to you by your health care provider. Make sure you discuss any questions you have with your healthcare provider. Document Revised: 01/21/2019 Document Reviewed: 01/21/2019 Elsevier Patient Education  [place].

## 2020-11-10 ENCOUNTER — Telehealth: Payer: Self-pay | Admitting: Family Medicine

## 2020-11-10 DIAGNOSIS — E890 Postprocedural hypothyroidism: Secondary | ICD-10-CM

## 2020-11-10 LAB — IRON,TIBC AND FERRITIN PANEL
%SAT: 57 % (calc) — ABNORMAL HIGH (ref 16–45)
Ferritin: 9 ng/mL — ABNORMAL LOW (ref 16–154)
Iron: 167 ug/dL (ref 40–190)
TIBC: 291 mcg/dL (calc) (ref 250–450)

## 2020-11-10 MED ORDER — LEVOTHYROXINE SODIUM 88 MCG PO CAPS: 1.0000 | ORAL_CAPSULE | Freq: Every day | ORAL | 3 refills | Status: DC

## 2020-11-10 NOTE — Telephone Encounter (Signed)
Please call patient Her thyroid levels are even a little bit more oversupplemented despite lower dose.  Therefore I have called in levothyroxine 88 mcg daily dosing.  She should start on this dose as soon as possible.  Her iron levels are improving -I would encourage her to continue taking her iron supplement Monday through Friday.  Hopefully this will be a happy medium for her tween the daily dosing and the 3 times a week dosing but keep her iron levels good.  Follow-up in 8 weeks for lab appointment only to recheck the thyroid on new dose

## 2020-11-10 NOTE — Telephone Encounter (Signed)
LVM for pt to CB regarding results.  

## 2020-11-11 NOTE — Telephone Encounter (Signed)
LVM for pt to CB regarding results.  

## 2020-11-12 NOTE — Telephone Encounter (Signed)
Letter sent.

## 2020-11-12 NOTE — Telephone Encounter (Signed)
LVM for pt to CB regarding results.  

## 2020-11-15 ENCOUNTER — Encounter: Payer: Self-pay | Admitting: Family Medicine

## 2020-11-30 ENCOUNTER — Encounter: Payer: Self-pay | Admitting: Family Medicine

## 2020-11-30 NOTE — Telephone Encounter (Signed)
This is not a medication we have ever discussed, and was not listed on her medication list on establishment.  Although this medication is typically used for diabetic management, I believe she is currently taking this medication for weight loss.  Before I am able to take over this prescription, we would need to discuss its use and risks.

## 2020-12-03 ENCOUNTER — Encounter: Payer: Self-pay | Admitting: Family Medicine

## 2020-12-09 ENCOUNTER — Encounter: Payer: Self-pay | Admitting: Family Medicine

## 2021-01-18 ENCOUNTER — Telehealth: Payer: Self-pay

## 2021-01-18 NOTE — Telephone Encounter (Signed)
Patient scheduled CPE via mychart and appt was scheduled in error.  Left message with patient, send for approval/override, if need  [phone]

## 2021-01-19 NOTE — Telephone Encounter (Signed)
No current appt sched. Please assist pt in next avail CPE slot.  Thanks

## 2021-01-19 NOTE — Telephone Encounter (Signed)
Scheduled CPE for 9/27.  Sent mychart message to patient.

## 2021-01-24 ENCOUNTER — Ambulatory Visit: Payer: 59 | Admitting: Family Medicine

## 2021-02-22 ENCOUNTER — Other Ambulatory Visit: Payer: Self-pay

## 2021-02-22 ENCOUNTER — Ambulatory Visit (INDEPENDENT_AMBULATORY_CARE_PROVIDER_SITE_OTHER): Payer: 59 | Admitting: Family Medicine

## 2021-02-22 ENCOUNTER — Encounter: Payer: Self-pay | Admitting: Family Medicine

## 2021-02-22 VITALS — BP 120/82 | HR 101 | Temp 98.4°F | Ht 61.25 in | Wt 150.0 lb

## 2021-02-22 DIAGNOSIS — R160 Hepatomegaly, not elsewhere classified: Secondary | ICD-10-CM

## 2021-02-22 DIAGNOSIS — C73 Malignant neoplasm of thyroid gland: Secondary | ICD-10-CM | POA: Diagnosis not present

## 2021-02-22 DIAGNOSIS — R000 Tachycardia, unspecified: Secondary | ICD-10-CM | POA: Diagnosis not present

## 2021-02-22 DIAGNOSIS — Z0001 Encounter for general adult medical examination with abnormal findings: Secondary | ICD-10-CM

## 2021-02-22 DIAGNOSIS — D508 Other iron deficiency anemias: Secondary | ICD-10-CM | POA: Diagnosis not present

## 2021-02-22 DIAGNOSIS — Z1231 Encounter for screening mammogram for malignant neoplasm of breast: Secondary | ICD-10-CM | POA: Diagnosis not present

## 2021-02-22 DIAGNOSIS — F419 Anxiety disorder, unspecified: Secondary | ICD-10-CM

## 2021-02-22 DIAGNOSIS — Z131 Encounter for screening for diabetes mellitus: Secondary | ICD-10-CM | POA: Diagnosis not present

## 2021-02-22 DIAGNOSIS — E890 Postprocedural hypothyroidism: Secondary | ICD-10-CM | POA: Diagnosis not present

## 2021-02-22 DIAGNOSIS — F418 Other specified anxiety disorders: Secondary | ICD-10-CM

## 2021-02-22 MED ORDER — DULOXETINE HCL 60 MG PO CPEP: 60.0000 mg | ORAL_CAPSULE | Freq: Every day | ORAL | 1 refills | Status: AC

## 2021-02-22 NOTE — Patient Instructions (Signed)
Great to see you today.  I have refilled the medication(s) we provide.   If labs were collected, we will inform you of lab results once received either by echart message or telephone call.   - echart message- for normal results that have been seen by the patient already.   - telephone call: abnormal results or if patient has not viewed results in their echart. Health Maintenance, Female Adopting a healthy lifestyle and getting preventive care are important in promoting health and wellness. Ask your health care provider about: The right schedule for you to have regular tests and exams. Things you can do on your own to prevent diseases and keep yourself healthy. What should I know about diet, weight, and exercise? Eat a healthy diet  Eat a diet that includes plenty of vegetables, fruits, low-fat dairy products, and lean protein. Do not eat a lot of foods that are high in solid fats, added sugars, or sodium. Maintain a healthy weight Body mass index (BMI) is used to identify weight problems. It estimates body fat based on height and weight. Your health care provider can help determine your BMI and help you achieve or maintain a healthy weight. Get regular exercise Get regular exercise. This is one of the most important things you can do for your health. Most adults should: Exercise for at least 150 minutes each week. The exercise should increase your heart rate and make you sweat (moderate-intensity exercise). Do strengthening exercises at least twice a week. This is in addition to the moderate-intensity exercise. Spend less time sitting. Even light physical activity can be beneficial. Watch cholesterol and blood lipids Have your blood tested for lipids and cholesterol at 40 years of age, then have this test every 5 years. Have your cholesterol levels checked more often if: Your lipid or cholesterol levels are high. You are older than 40 years of age. You are at high risk for heart  disease. What should I know about cancer screening? Depending on your health history and family history, you may need to have cancer screening at various ages. This may include screening for: Breast cancer. Cervical cancer. Colorectal cancer. Skin cancer. Lung cancer. What should I know about heart disease, diabetes, and high blood pressure? Blood pressure and heart disease High blood pressure causes heart disease and increases the risk of stroke. This is more likely to develop in people who have high blood pressure readings, are of African descent, or are overweight. Have your blood pressure checked: Every 3-5 years if you are 18-39 years of age. Every year if you are 40 years old or older. Diabetes Have regular diabetes screenings. This checks your fasting blood sugar level. Have the screening done: Once every three years after age 40 if you are at a normal weight and have a low risk for diabetes. More often and at a younger age if you are overweight or have a high risk for diabetes. What should I know about preventing infection? Hepatitis B If you have a higher risk for hepatitis B, you should be screened for this virus. Talk with your health care provider to find out if you are at risk for hepatitis B infection. Hepatitis C Testing is recommended for: Everyone born from 1945 through 1965. Anyone with known risk factors for hepatitis C. Sexually transmitted infections (STIs) Get screened for STIs, including gonorrhea and chlamydia, if: You are sexually active and are younger than 40 years of age. You are older than 40 years of age and your   health care provider tells you that you are at risk for this type of infection. Your sexual activity has changed since you were last screened, and you are at increased risk for chlamydia or gonorrhea. Ask your health care provider if you are at risk. Ask your health care provider about whether you are at high risk for HIV. Your health care provider  may recommend a prescription medicine to help prevent HIV infection. If you choose to take medicine to prevent HIV, you should first get tested for HIV. You should then be tested every 3 months for as long as you are taking the medicine. Pregnancy If you are about to stop having your period (premenopausal) and you may become pregnant, seek counseling before you get pregnant. Take 400 to 800 micrograms (mcg) of folic acid every day if you become pregnant. Ask for birth control (contraception) if you want to prevent pregnancy. Osteoporosis and menopause Osteoporosis is a disease in which the bones lose minerals and strength with aging. This can result in bone fractures. If you are 65 years old or older, or if you are at risk for osteoporosis and fractures, ask your health care provider if you should: Be screened for bone loss. Take a calcium or vitamin D supplement to lower your risk of fractures. Be given hormone replacement therapy (HRT) to treat symptoms of menopause. Follow these instructions at home: Lifestyle Do not use any products that contain nicotine or tobacco, such as cigarettes, e-cigarettes, and chewing tobacco. If you need help quitting, ask your health care provider. Do not use street drugs. Do not share needles. Ask your health care provider for help if you need support or information about quitting drugs. Alcohol use Do not drink alcohol if: Your health care provider tells you not to drink. You are pregnant, may be pregnant, or are planning to become pregnant. If you drink alcohol: Limit how much you use to 0-1 drink a day. Limit intake if you are breastfeeding. Be aware of how much alcohol is in your drink. In the [place] one drink equals one 12 oz bottle of beer (355 mL), one 5 oz glass of wine (148 mL), or one 1 oz glass of hard liquor (44 mL). General instructions Schedule regular health, dental, and eye exams. Stay current with your vaccines. Tell your health care  provider if: You often feel depressed. You have ever been abused or do not feel safe at home. Summary Adopting a healthy lifestyle and getting preventive care are important in promoting health and wellness. Follow your health care provider's instructions about healthy diet, exercising, and getting tested or screened for diseases. Follow your health care provider's instructions on monitoring your cholesterol and blood pressure. This information is not intended to replace advice given to you by your health care provider. Make sure you discuss any questions you have with your health care provider. Document Revised: 07/23/2020 Document Reviewed: 05/08/2018 Elsevier Patient Education  2022 [employer]  

## 2021-02-22 NOTE — Progress Notes (Signed)
This visit occurred during the SARS-CoV-2 public health emergency.  Safety protocols were in place, including screening questions prior to the visit, additional usage of staff PPE, and extensive cleaning of exam room while observing appropriate contact time as indicated for disinfecting solutions.    Patient ID: Lynn Chapman, female  DOB: 1981/01/26, 40 y.o.   MRN: [id number] Patient Care Team    Relationship Specialty Notifications Start End  Ma Hillock, DO PCP - General Family Medicine  03/30/20     Chief Complaint  Patient presents with   Annual Exam    Pt is fasting;     Subjective: Lynn Chapman is a 40 y.o.  Female  present for CPE/cmc. All past medical history, surgical history, allergies, family history, immunizations, medications and social history were updated in the electronic medical record today. All recent labs, ED visits and hospitalizations within the last year were reviewed.  Health maintenance:  Colonoscopy: No family history.  Routine screening to start at 36.   Mammogram: No family history.  Routine screening to start at 40, order placed for her today. Cervical cancer screening: last pap: 01/08/2020 Immunizations: tdap up-to-date 2017, Influenza up-to-date 2022 (encouraged yearly), COVID series and booster up-to-date. Infectious disease screening: HIV declined, Hep C completed DEXA: Ordered today due to hyperthyroid state Assistive device: none Oxygen UDJ:SHFW Patient has a Dental home. Hospitalizations/ED visits: reviewed  Depression with anxiety Patient reports she is compliant with Cymbalta 60 mg daily and feels her depression anxiety is well managed on this medication.   Chronic inflammatory arthritis Patient has a history of inflammatory arthritis.  She has been on methotrexate, Plaquenil and possibly Humira in the past.  She had followed with rheumatology in the past.  She is currently not taking any medications for her arthritis by her choice.    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  She is compliant with levothyroxine 88 mcg daily and Cytomel.  She has been hypothyroid for some time, she states initially this was purposely done secondary to her thyroid cancer status.  She does not want to stop the Cytomel, she feels this is what gives her any energy.  We have decreased her thyroid dose last appointment.  She endorses palpitations and feeling anxious at times.  She had followed with endocrinology in the past which were monitoring her thyroid levels and thyroid globin annually.  Per records they feel she is at low risk for recurrence of cancer.  Depression screen [facility] 2/9 02/22/2021 08/04/2020 03/30/2020  Decreased Interest 0 0 0  Down, Depressed, Hopeless 0 0 0  PHQ - 2 Score 0 0 0   No flowsheet data found.   Immunization History  Administered Date(s) Administered   DT (Pediatric) 09/27/2015   Hepatitis A, Adult 06/29/2016   Influenza, Seasonal, Injecte, Preservative Fre 03/02/2015, 03/30/2017, 04/22/2019   Influenza,inj,Quad PF,6+ Mos 03/02/2015, 03/30/2017, 04/22/2019, 03/30/2020   Influenza-Unspecified 03/03/2011, 03/29/2018, 04/11/2018, 04/22/2019, 03/30/2020, 02/04/2021   Moderna SARS-COV2 Booster Vaccination 02/11/2021   Moderna Sars-Covid-2 Vaccination 06/02/2019, 06/30/2019   PPD Test 02/04/2016   Td 01/30/2006   Tdap 03/03/2011, 10/05/2015   Past Medical History:  Diagnosis Date   ADD (attention deficit disorder)    Chronic inflammatory arthritis    Depression with anxiety    History of preterm delivery, currently pregnant 02/21/2011   Kidney stone 03/30/2020   Formatting of this note might be different from the original. removal in 2001   Kidney stones  Nephrolithiasis 2001   Ovarian cyst    right   Papillary thyroid carcinoma (HCC)    s/p surigcal hypothyroidism   Post-surgical hypothyroidism    Trigeminal neuralgia of  right side of face 2019   Weakness of left side of body    No Known Allergies Past Surgical History:  Procedure Laterality Date   APPENDECTOMY  2020   BREAST BIOPSY     2001, 2017   BREAST ENHANCEMENT SURGERY  2018   TOTAL THYROIDECTOMY     total thyroidectomy with central neck dissection and RAI remnant ablation; papillary thyroid carcinoma   UMBILICAL HERNIA REPAIR  2011   Family History  Problem Relation Age of Onset   Kidney disease Mother        stage 29   Heart disease Mother    Hyperlipidemia Mother    Hypertension Mother    Hypertension Father    Hyperlipidemia Father    COPD Father    Learning disabilities Maternal Uncle    Asthma Maternal Grandmother    Prostate cancer Maternal Grandfather    Social History   Social History Narrative   Marital status/children/pets: Married [place]), 4 children.    Education/employment: college education, NP   Safety:      -smoke alarm in the home:Yes     - wears seatbelt: Yes     - Feels safe in their relationships: Yes    Allergies as of 02/22/2021   No Known Allergies      Medication List        Accurate as of February 22, 2021  1:35 PM. If you have any questions, ask your nurse or doctor.          STOP taking these medications    Rybelsus 7 MG Tabs Generic drug: Semaglutide Stopped by: Howard Pouch, DO       TAKE these medications    cetirizine 10 MG tablet Commonly known as: ZYRTEC Take 10 mg by mouth daily.   DULoxetine 60 MG capsule Commonly known as: CYMBALTA Take 1 capsule (60 mg total) by mouth daily.   iron polysaccharides 150 MG capsule Commonly known as: NIFEREX Take 1 capsule (150 mg total) by mouth daily.   Levothyroxine Sodium 88 MCG Caps Commonly known as: Tirosint Take 1 capsule (88 mcg total) by mouth daily before breakfast.   liothyronine 5 MCG tablet Commonly known as: CYTOMEL Take 1 tablet (5 mcg total) by mouth daily.   multivitamin tablet Take 1 tablet by mouth  daily.   vitamin D3 50 MCG (2000 UT) Caps Take by mouth.        All past medical history, surgical history, allergies, family history, immunizations andmedications were updated in the EMR today and reviewed under the history and medication portions of their EMR.     No results found for this or any previous visit (from the past 2160 hour(s)).  US Abdomen Complete Result Date: 08/18/2020 IMPRESSION: Unremarkable ultrasound of the abdomen. No focal liver lesion is visualized, if continued concern for hepatic lesion recommend further evaluation with MRI or CT. Electronically Signed   By: Dahlia Bailiff MD   On: 08/18/2020 15:48    ROS: 14 pt review of systems performed and negative (unless mentioned in an HPI)  Objective: BP 120/82   Pulse (!) 101   Temp 98.4 F (36.9 C) (Oral)   Ht 5' 1.25" (1.556 m)   Wt 150 lb (68 kg)   SpO2 98%   BMI 28.11 kg/m  Gen:  Afebrile. No acute distress. Nontoxic in appearance, well-developed, well-nourished, pleasant female. HENT: AT. [place]. Bilateral TM visualized and normal in appearance, normal external auditory canal. MMM, no oral lesions, adequate dentition. Bilateral nares within normal limits. Throat without erythema, ulcerations or exudates.  No cough on exam, no hoarseness on exam. Eyes:Pupils Equal Round Reactive to light, Extraocular movements intact,  Conjunctiva without redness, discharge or icterus. Neck/lymp/endocrine: Supple, no lymphadenopathy, no thyromegaly CV: Sinus rhythm, tachycardic, no murmur, no edema, +2/4 P posterior tibialis pulses.  Chest: CTAB, no wheeze, rhonchi or crackles.  Normal respiratory effort.  Good air movement. Abd: Soft.  Flat. NTND. BS present.  No masses palpated. No hepatosplenomegaly. No rebound tenderness or guarding. Skin: No rashes, purpura or petechiae. Warm and well-perfused. Skin intact. Neuro/Msk: Normal gait. PERLA. EOMi. Alert. Oriented x3.  Cranial nerves II through XII intact. Muscle strength 5/5  upper/lower extremity. DTRs equal bilaterally. Psych: Normal affect, dress and demeanor. Normal speech. Normal thought content and judgment.  No results found.  Assessment/plan: Hollyann Pablo is a 40 y.o. female present for CPE  iron deficiency anemia - Iron, TIBC and Ferritin Panel  Papillary thyroid carcinoma (HCC)/low  tsh/Post-surgical hypothyroidism  continue Cytomel 5 mcg daily> she does not want to decrease or change dose if at all possible. Continue levothyroxine 88 mcg-for now.  Dose may be changed after results received. - TSH - T4, free - DG Bone Density; Future - T3, free - Thyroglobulin antibody  Liver mass, left lobe - Comprehensive metabolic panel Diabetes mellitus screening - Hemoglobin A1c  Breast cancer screening by mammogram - MM 3D SCREEN BREAST BILATERAL; Future  Depression with anxiety Stable Continue Cymbalta 60 mg daily  Tachycardia/palpitations Likely due to oversupplemented thyroid.  We are working on lowering dose into normal range.  In the meantime, low-dose propranolol twice daily prescribed.  Encounter for general adult medical examination with abnormal findings Colonoscopy: No family history.  Routine screening to start at 64.   Mammogram: No family history.  Routine screening to start at 40, order placed for her today. Cervical cancer screening: last pap: 01/08/2020 Immunizations: tdap up-to-date 2017, Influenza up-to-date 2022 (encouraged yearly), COVID series and booster up-to-date. Infectious disease screening: HIV declined, Hep C completed DEXA: Ordered today due to hyperthyroid state Patient was encouraged to exercise greater than 150 minutes a week. Patient was encouraged to choose a diet filled with fresh fruits and vegetables, and lean meats. AVS provided to patient today for education/recommendation on gender specific health and safety maintenance. No follow-ups on file.    Orders Placed This Encounter  Procedures   DG Bone  Density   MM 3D SCREEN BREAST BILATERAL   Comprehensive metabolic panel   Hemoglobin A1c   Lipid panel   TSH   Iron, TIBC and Ferritin Panel   T4, free   T3, free   Thyroglobulin antibody    No orders of the defined types were placed in this encounter.  Referral Orders  No referral(s) requested today     Electronically signed by: Howard Pouch, [place]

## 2021-02-23 ENCOUNTER — Telehealth: Payer: Self-pay | Admitting: Family Medicine

## 2021-02-23 LAB — COMPREHENSIVE METABOLIC PANEL
AG Ratio: 1.5 (calc) (ref 1.0–2.5)
ALT: 15 U/L (ref 6–29)
AST: 20 U/L (ref 10–30)
Albumin: 4.1 g/dL (ref 3.6–5.1)
Alkaline phosphatase (APISO): 75 U/L (ref 31–125)
BUN: 11 mg/dL (ref 7–25)
CO2: 23 mmol/L (ref 20–32)
Calcium: 9.3 mg/dL (ref 8.6–10.2)
Chloride: 104 mmol/L (ref 98–110)
Creat: 0.67 mg/dL (ref 0.50–0.97)
Globulin: 2.8 g/dL (calc) (ref 1.9–3.7)
Glucose, Bld: 70 mg/dL (ref 65–99)
Potassium: 4.1 mmol/L (ref 3.5–5.3)
Sodium: 139 mmol/L (ref 135–146)
Total Bilirubin: 0.8 mg/dL (ref 0.2–1.2)
Total Protein: 6.9 g/dL (ref 6.1–8.1)

## 2021-02-23 LAB — HEMOGLOBIN A1C
Hgb A1c MFr Bld: 5 % of total Hgb (ref <5.7)
Mean Plasma Glucose: 97 mg/dL
eAG (mmol/L): 5.4 mmol/L

## 2021-02-23 LAB — IRON,TIBC AND FERRITIN PANEL
%SAT: 28 % (calc) (ref 16–45)
Ferritin: 14 ng/mL — ABNORMAL LOW (ref 16–154)
Iron: 100 ug/dL (ref 40–190)
TIBC: 358 mcg/dL (calc) (ref 250–450)

## 2021-02-23 LAB — LIPID PANEL
Cholesterol: 235 mg/dL — ABNORMAL HIGH (ref <200)
HDL: 69 mg/dL (ref >=50)
LDL Cholesterol (Calc): 148 mg/dL (calc) — ABNORMAL HIGH
Non-HDL Cholesterol (Calc): 166 mg/dL (calc) — ABNORMAL HIGH (ref <130)
Total CHOL/HDL Ratio: 3.4 (calc) (ref <5.0)
Triglycerides: 76 mg/dL (ref <150)

## 2021-02-23 LAB — T3, FREE: T3, Free: 2.9 pg/mL (ref 2.3–4.2)

## 2021-02-23 LAB — THYROGLOBULIN ANTIBODY: Thyroglobulin Ab: <1 IU/mL (ref <=1)

## 2021-02-23 LAB — TSH: TSH: 0.18 mIU/L — ABNORMAL LOW

## 2021-02-23 LAB — T4, FREE: Free T4: 1.1 ng/dL (ref 0.8–1.8)

## 2021-02-23 MED ORDER — LEVOTHYROXINE SODIUM 88 MCG PO CAPS: ORAL_CAPSULE | ORAL | 3 refills | Status: AC

## 2021-02-23 NOTE — Telephone Encounter (Signed)
LVM for pt to CB regarding results.  

## 2021-02-23 NOTE — Telephone Encounter (Signed)
Please inform patient: Her thyroid levels are still oversupplemented.  Continue the levothyroxine 88 mcg tab, but decrease dose by taking half a tab 1 day a week and 1 tab x6 days a week Her thyroglobulin antibodies are still less than 1. Cholesterol panel overall looks good with a great HDL of 69.  Triglycerides are 76.  Her bad cholesterol/LDL is 148.  This is okay, could use some improvement by increasing exercise habits. -Iron panel continues to improve now with normal saturations and her ferritin stores are increasing. Liver and kidney function, as well as electrolytes are normal.

## 2021-02-24 ENCOUNTER — Encounter: Payer: Self-pay | Admitting: Family Medicine

## 2021-02-24 ENCOUNTER — Telehealth: Payer: Self-pay | Admitting: Family Medicine

## 2021-02-24 MED ORDER — PROPRANOLOL HCL 20 MG PO TABS: 10.0000 mg | ORAL_TABLET | Freq: Two times a day (BID) | ORAL | 5 refills | Status: DC

## 2021-02-24 NOTE — Telephone Encounter (Signed)
LVM for pt to CB regarding results.  

## 2021-02-24 NOTE — Telephone Encounter (Signed)
These inform patient I have called in the propranolol low-dose 20 mg twice daily for her to take for her palpitations and tachycardia. I would encourage her to start this intake for 1 to 2 weeks while the levothyroxine decreases in her system.  This will help with feeling more anxious as well.  After those 2 weeks she can take 1 tab every 12 hours as needed for palpitations or anxiousness.

## 2021-02-24 NOTE — Telephone Encounter (Signed)
Message sent to pt via mychart as requested communication.

## 2021-02-24 NOTE — Telephone Encounter (Signed)
Sent on mychart

## 2021-02-28 ENCOUNTER — Ambulatory Visit: Payer: 59 | Admitting: Family Medicine

## 2021-03-17 ENCOUNTER — Other Ambulatory Visit: Payer: Self-pay | Admitting: Family Medicine

## 2021-03-17 ENCOUNTER — Ambulatory Visit (HOSPITAL_BASED_OUTPATIENT_CLINIC_OR_DEPARTMENT_OTHER)
Admission: RE | Admit: 2021-03-17 | Discharge: 2021-03-17 | Disposition: A | Discharge status: Home / Self Care | Payer: 59 | Source: Ambulatory Visit | Attending: Family Medicine | Admitting: Family Medicine

## 2021-03-17 ENCOUNTER — Other Ambulatory Visit: Payer: Self-pay

## 2021-03-17 ENCOUNTER — Encounter (HOSPITAL_BASED_OUTPATIENT_CLINIC_OR_DEPARTMENT_OTHER): Payer: Self-pay | Admitting: Radiology

## 2021-03-17 DIAGNOSIS — Z1231 Encounter for screening mammogram for malignant neoplasm of breast: Secondary | ICD-10-CM

## 2021-03-17 DIAGNOSIS — C73 Malignant neoplasm of thyroid gland: Secondary | ICD-10-CM | POA: Diagnosis present

## 2021-03-17 DIAGNOSIS — E890 Postprocedural hypothyroidism: Secondary | ICD-10-CM | POA: Insufficient documentation

## 2021-03-23 ENCOUNTER — Encounter: Payer: Self-pay | Admitting: Family Medicine

## 2021-04-04 ENCOUNTER — Encounter: Payer: Self-pay | Admitting: Family Medicine

## 2021-04-05 NOTE — Telephone Encounter (Signed)
LVM for pt to CB to clarify messages that has been sent.   Notes: Make sure pt has made changes to thyroid from results and how pt is taking propanolol.

## 2021-04-06 NOTE — Telephone Encounter (Signed)
LVM for pt to CB to clarify messages that has been sent.

## 2021-04-12 ENCOUNTER — Other Ambulatory Visit: Payer: Self-pay

## 2021-04-12 MED ORDER — LIOTHYRONINE SODIUM 5 MCG PO TABS: 5.0000 ug | ORAL_TABLET | Freq: Every day | ORAL | 0 refills | Status: DC

## 2021-04-17 ENCOUNTER — Encounter: Payer: Self-pay | Admitting: Family Medicine

## 2021-04-18 ENCOUNTER — Ambulatory Visit: Payer: 59 | Admitting: Family Medicine

## 2021-05-20 ENCOUNTER — Other Ambulatory Visit: Payer: Self-pay | Admitting: Family Medicine

## 2021-06-09 ENCOUNTER — Other Ambulatory Visit: Payer: Self-pay | Admitting: Family Medicine

## 2021-06-10 MED ORDER — LIOTHYRONINE SODIUM 5 MCG PO TABS: 5.0000 ug | ORAL_TABLET | Freq: Every day | ORAL | 0 refills | Status: AC

## 2021-06-29 ENCOUNTER — Ambulatory Visit: Payer: 59 | Admitting: Family Medicine

## 2021-07-05 ENCOUNTER — Encounter: Payer: Self-pay | Admitting: Family Medicine

## 2021-07-06 ENCOUNTER — Ambulatory Visit: Payer: 59 | Admitting: Family Medicine

## 2021-07-06 ENCOUNTER — Other Ambulatory Visit: Payer: Self-pay | Admitting: Family Medicine

## 2021-07-06 NOTE — Telephone Encounter (Signed)
Appt cancelled per patient request

## 2021-07-06 NOTE — Telephone Encounter (Signed)
FYI

## 2021-07-08 ENCOUNTER — Encounter: Payer: Self-pay | Admitting: Family Medicine

## 2021-07-08 NOTE — Telephone Encounter (Signed)
Pt has missed/cancelled late at minimum 5x. 09/23/20, 10/20/20, 11/05/20, 06/29/2021, 07/06/2021  Dismissal printed for provider signature.

## 2021-07-11 NOTE — Telephone Encounter (Signed)
Letter was revised, printed, signed by the PCP and letter mailed to patient,

## 2021-07-11 NOTE — Telephone Encounter (Signed)
Provider signed.

## 2021-07-11 NOTE — Telephone Encounter (Signed)
Placed on Dana desk's by provider for revision.

## 2021-07-12 NOTE — Telephone Encounter (Signed)
Please advise pt below.

## 2022-04-12 ENCOUNTER — Ambulatory Visit (HOSPITAL_BASED_OUTPATIENT_CLINIC_OR_DEPARTMENT_OTHER)
Admission: RE | Admit: 2022-04-12 | Discharge: 2022-04-12 | Disposition: A | Discharge status: Home / Self Care | Payer: 59 | Source: Ambulatory Visit | Attending: Family Medicine | Admitting: Family Medicine

## 2022-04-12 ENCOUNTER — Other Ambulatory Visit (HOSPITAL_BASED_OUTPATIENT_CLINIC_OR_DEPARTMENT_OTHER): Payer: Self-pay | Admitting: Physician Assistant

## 2022-04-12 ENCOUNTER — Encounter (HOSPITAL_BASED_OUTPATIENT_CLINIC_OR_DEPARTMENT_OTHER): Payer: Self-pay | Admitting: Radiology

## 2022-04-12 ENCOUNTER — Other Ambulatory Visit (HOSPITAL_BASED_OUTPATIENT_CLINIC_OR_DEPARTMENT_OTHER): Payer: Self-pay

## 2022-04-12 ENCOUNTER — Other Ambulatory Visit (HOSPITAL_BASED_OUTPATIENT_CLINIC_OR_DEPARTMENT_OTHER): Payer: Self-pay | Admitting: Family Medicine

## 2022-04-12 DIAGNOSIS — Z1231 Encounter for screening mammogram for malignant neoplasm of breast: Secondary | ICD-10-CM

## 2023-03-25 ENCOUNTER — Other Ambulatory Visit: Payer: Self-pay

## 2023-03-25 ENCOUNTER — Emergency Department (HOSPITAL_BASED_OUTPATIENT_CLINIC_OR_DEPARTMENT_OTHER): Payer: BC Managed Care – PPO

## 2023-03-25 ENCOUNTER — Emergency Department (HOSPITAL_BASED_OUTPATIENT_CLINIC_OR_DEPARTMENT_OTHER)
Admission: EM | Admit: 2023-03-25 | Discharge: 2023-03-25 | Disposition: A | Discharge status: Home / Self Care | Payer: BC Managed Care – PPO | Attending: Emergency Medicine | Admitting: Emergency Medicine

## 2023-03-25 DIAGNOSIS — R112 Nausea with vomiting, unspecified: Secondary | ICD-10-CM | POA: Insufficient documentation

## 2023-03-25 DIAGNOSIS — R109 Unspecified abdominal pain: Secondary | ICD-10-CM | POA: Diagnosis present

## 2023-03-25 LAB — BASIC METABOLIC PANEL
Anion gap: 8 (ref 5–15)
BUN: 14 mg/dL (ref 6–20)
CO2: 24 mmol/L (ref 22–32)
Calcium: 8.5 mg/dL — ABNORMAL LOW (ref 8.9–10.3)
Chloride: 104 mmol/L (ref 98–111)
Creatinine, Ser: 0.52 mg/dL (ref 0.44–1.00)
GFR, Estimated: >60 mL/min (ref >60)
Glucose, Bld: 135 mg/dL — ABNORMAL HIGH (ref 70–99)
Potassium: 3.7 mmol/L (ref 3.5–5.1)
Sodium: 136 mmol/L (ref 135–145)

## 2023-03-25 LAB — CBC WITH DIFFERENTIAL/PLATELET
Abs Immature Granulocytes: 0.03 10*3/uL (ref 0.00–0.07)
Basophils Absolute: 0 10*3/uL (ref 0.0–0.1)
Basophils Relative: 0 %
Eosinophils Absolute: 0 10*3/uL (ref 0.0–0.5)
Eosinophils Relative: 0 %
HCT: 31.9 % — ABNORMAL LOW (ref 36.0–46.0)
Hemoglobin: 10.4 g/dL — ABNORMAL LOW (ref 12.0–15.0)
Immature Granulocytes: 0 %
Lymphocytes Relative: 2 %
Lymphs Abs: 0.2 10*3/uL — ABNORMAL LOW (ref 0.7–4.0)
MCH: 25.6 pg — ABNORMAL LOW (ref 26.0–34.0)
MCHC: 32.6 g/dL (ref 30.0–36.0)
MCV: 78.6 fL — ABNORMAL LOW (ref 80.0–100.0)
Monocytes Absolute: 0.3 10*3/uL (ref 0.1–1.0)
Monocytes Relative: 4 %
Neutro Abs: 8.5 10*3/uL — ABNORMAL HIGH (ref 1.7–7.7)
Neutrophils Relative %: 94 %
Platelets: 435 10*3/uL — ABNORMAL HIGH (ref 150–400)
RBC: 4.06 MIL/uL (ref 3.87–5.11)
RDW: 14.8 % (ref 11.5–15.5)
WBC: 9.1 10*3/uL (ref 4.0–10.5)
nRBC: 0 % (ref 0.0–0.2)

## 2023-03-25 LAB — URINALYSIS, ROUTINE W REFLEX MICROSCOPIC
Bilirubin Urine: NEGATIVE
Glucose, UA: NEGATIVE mg/dL
Hgb urine dipstick: NEGATIVE
Ketones, ur: NEGATIVE mg/dL
Leukocytes,Ua: NEGATIVE
Nitrite: NEGATIVE
Specific Gravity, Urine: 1.029 (ref 1.005–1.030)
pH: 6.5 (ref 5.0–8.0)

## 2023-03-25 LAB — PREGNANCY, URINE: Preg Test, Ur: NEGATIVE

## 2023-03-25 MED ORDER — METHOCARBAMOL 500 MG PO TABS: 500.0000 mg | ORAL_TABLET | Freq: Two times a day (BID) | ORAL | 0 refills | Status: AC

## 2023-03-25 MED ORDER — FENTANYL CITRATE PF 50 MCG/ML IJ SOSY
50.0000 ug | PREFILLED_SYRINGE | Freq: Once | INTRAMUSCULAR | Status: AC
  Administered 2023-03-25: 50 ug via INTRAVENOUS
  Filled 2023-03-25: qty 1

## 2023-03-25 MED ORDER — ONDANSETRON HCL 4 MG/2ML IJ SOLN
4.0000 mg | Freq: Once | INTRAMUSCULAR | Status: AC
  Administered 2023-03-25: 4 mg via INTRAVENOUS
  Filled 2023-03-25: qty 2

## 2023-03-25 MED ORDER — NAPROXEN 500 MG PO TABS: 500.0000 mg | ORAL_TABLET | Freq: Two times a day (BID) | ORAL | 0 refills | Status: AC

## 2023-03-25 MED ORDER — KETOROLAC TROMETHAMINE 30 MG/ML IJ SOLN
15.0000 mg | Freq: Once | INTRAMUSCULAR | Status: AC
  Administered 2023-03-25: 15 mg via INTRAVENOUS
  Filled 2023-03-25: qty 1

## 2023-03-25 NOTE — ED Triage Notes (Signed)
C/o right flank pain that started around 8pm with nausea. States pain is dull and sharp at times. States pain is constant. Hx of kidney stone 8 years ago. Unable to recall last time she urinated. Pt is obvious pain on arrival. Unable to sit still.

## 2023-03-25 NOTE — ED Provider Notes (Signed)
[place] EMERGENCY DEPARTMENT AT [facility]  Provider Note  CSN: [id number] Arrival date & time: 03/25/23 0128  History Chief Complaint  Patient presents with   Flank Pain    Lynn Chapman is a 42 y.o. female reports onset of R flank pain around 1900hrs, comes and goes, radiates to R abdomen, associated with N/V. Has had prior kidney stone years ago. No dysuria or hematuria. No fever.    Home Medications Prior to Admission medications   Medication Sig Start Date End Date Taking? Authorizing Provider  methocarbamol (ROBAXIN) 500 MG tablet Take 1 tablet (500 mg total) by mouth 2 (two) times daily. 03/25/23  Yes Pollyann Savoy, MD  naproxen (NAPROSYN) 500 MG tablet Take 1 tablet (500 mg total) by mouth 2 (two) times daily. 03/25/23  Yes Pollyann Savoy, MD  Adapalene-Benzoyl Peroxide 0.1-2.5 % gel Epiduo 0.1 %-2.5 % topical gel with pump    [provider]  amphetamine-dextroamphetamine (ADDERALL) 10 MG tablet dextroamphetamine-amphetamine 10 mg tablet    [provider]  Cholecalciferol (VITAMIN D3) 50 MCG (2000 UT) CAPS Take by mouth.    [provider]  Dapsone 7.5 % GEL Aczone 7.5 % topical gel with pump    [provider]  DULoxetine (CYMBALTA) 60 MG capsule Take 1 capsule (60 mg total) by mouth daily. 02/22/21   Kuneff, Renee A, DO  iron polysaccharides (NIFEREX) 150 MG capsule Take 1 capsule (150 mg total) by mouth daily. 08/05/20   Kuneff, Renee A, DO  Levothyroxine Sodium (TIROSINT) 88 MCG CAPS 1 tab daily x6 days a week on an empty stomach, 0.5 tab 1 day a week on empty stomach. 02/23/21   Kuneff, Renee A, DO  liothyronine (CYTOMEL) 5 MCG tablet Take 1 tablet (5 mcg total) by mouth daily. 06/10/21   Kuneff, Renee A, DO  Multiple Vitamin (MULTIVITAMIN) tablet Take 1 tablet by mouth daily.    [provider]  omeprazole (PRILOSEC) 20 MG capsule Take by mouth.    [provider]     Allergies    Patient has no  known allergies.   Review of Systems   Review of Systems Please see HPI for pertinent positives and negatives  Physical Exam BP (!) 151/96 (BP Location: Right Arm)   Pulse (!) 115   Temp 98.5 F (36.9 C)   Resp 20   Ht 5\' 3"  (1.6 m)   Wt 48.5 kg   LMP 03/05/2023 (Approximate)   SpO2 99%   BMI 18.95 kg/m   Physical Exam Vitals and nursing note reviewed.  Constitutional:      Appearance: Normal appearance.  HENT:     Head: Normocephalic and atraumatic.     Nose: Nose normal.     Mouth/Throat:     Mouth: Mucous membranes are moist.  Eyes:     Extraocular Movements: Extraocular movements intact.     Conjunctiva/sclera: Conjunctivae normal.  Cardiovascular:     Rate and Rhythm: Normal rate.  Pulmonary:     Effort: Pulmonary effort is normal.     Breath sounds: Normal breath sounds.  Abdominal:     General: Abdomen is flat.     Palpations: Abdomen is soft.     Tenderness: There is no abdominal tenderness. There is no right CVA tenderness, left CVA tenderness or guarding.  Musculoskeletal:        General: No swelling. Normal range of motion.     Cervical back: Neck supple.  Skin:    General:  Skin is warm and dry.  Neurological:     General: No focal deficit present.     Mental Status: She is alert.  Psychiatric:        Mood and Affect: Mood normal.     ED Results / Procedures / Treatments   EKG None  Procedures Procedures  Medications Ordered in the ED Medications  ketorolac (TORADOL) 30 MG/ML injection 15 mg (has no administration in time range)  fentaNYL (SUBLIMAZE) injection 50 mcg (50 mcg Intravenous Given 03/25/23 0201)  ondansetron (ZOFRAN) injection 4 mg (4 mg Intravenous Given 03/25/23 0158)    Initial Impression and Plan  Patient here with symptoms most consistent with renal colic. Will check labs and send for CT. Pain/nausea meds for comfort.   ED Course   Clinical Course as of 03/25/23 0313  Wynelle Link Mar 25, 2023  0204 CBC with mild anemia,   [CS]  0204 UA is clear, no hematuria or infection.  [CS]  0208 HCG is negative.  [CS]  0227 BMP is unremarkable.  [CS]  [id number] I personally viewed the images from radiology studies and agree with radiologist interpretation: CT neg for hydronephrosis, does not appear to have ureterolithiasis, but pelvic calcifications difficult to distinguish from phleboliths. Her pain is improved but not completely resolved. Will give a dose of toradol, plan discharge with Rx for naprosyn, robaxin. PCP follow up, RTED for any other concerns.   [CS]    Clinical Course User Index [CS] Pollyann Savoy, MD     MDM Rules/Calculators/A&P Medical Decision Making Problems Addressed: Flank pain: acute illness or injury  Amount and/or Complexity of Data Reviewed Labs: ordered. Decision-making details documented in ED Course. Radiology: ordered and independent interpretation performed. Decision-making details documented in ED Course.  Risk Prescription drug management. Parenteral controlled substances.     Final Clinical Impression(s) / ED Diagnoses Final diagnoses:  Flank pain    Rx / DC Orders ED Discharge Orders          Ordered    naproxen (NAPROSYN) 500 MG tablet  2 times daily        03/25/23 0313    methocarbamol (ROBAXIN) 500 MG tablet  2 times daily        03/25/23 0313             Pollyann Savoy, MD 03/25/23 [phone]

## 2023-08-15 IMAGING — MG DIGITAL SCREENING BREAST BILAT IMPLANT W/ TOMO W/ CAD
8 of 12 series · 8 of 28 positions shown · non-contrast
Comparison: Previous exam(s).

CLINICAL DATA: Screening.

EXAM:
DIGITAL SCREENING BILATERAL MAMMOGRAM WITH IMPLANTS, CAD AND
TOMOSYNTHESIS
TECHNIQUE: Bilateral screening digital craniocaudal and mediolateral oblique
mammograms were obtained. Bilateral screening digital breast
tomosynthesis was performed. The images were evaluated with
computer-aided detection. Standard and/or implant displaced views
were performed.

[R CC]
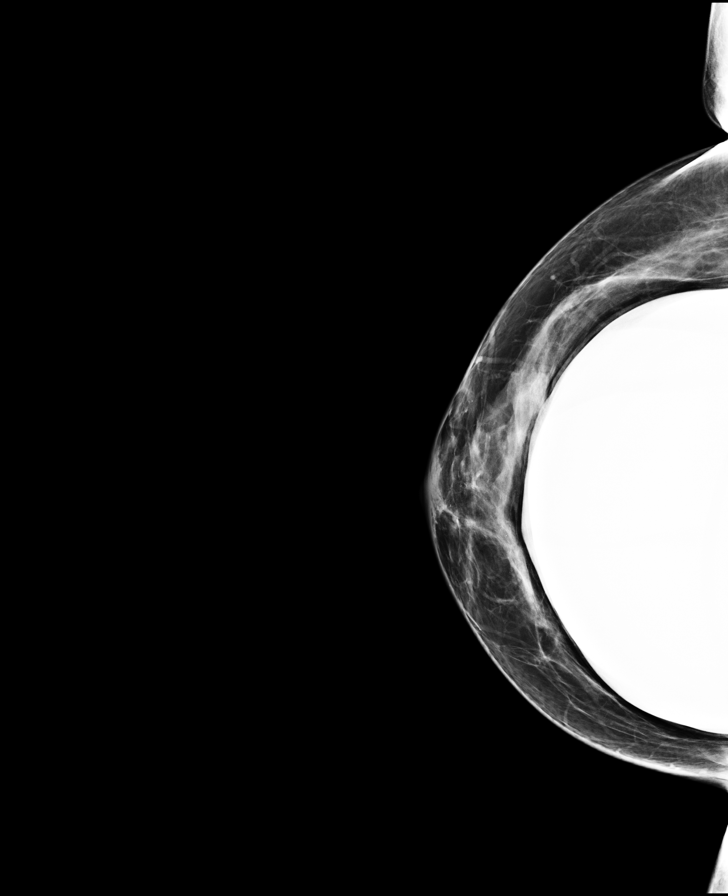

[L MLO]
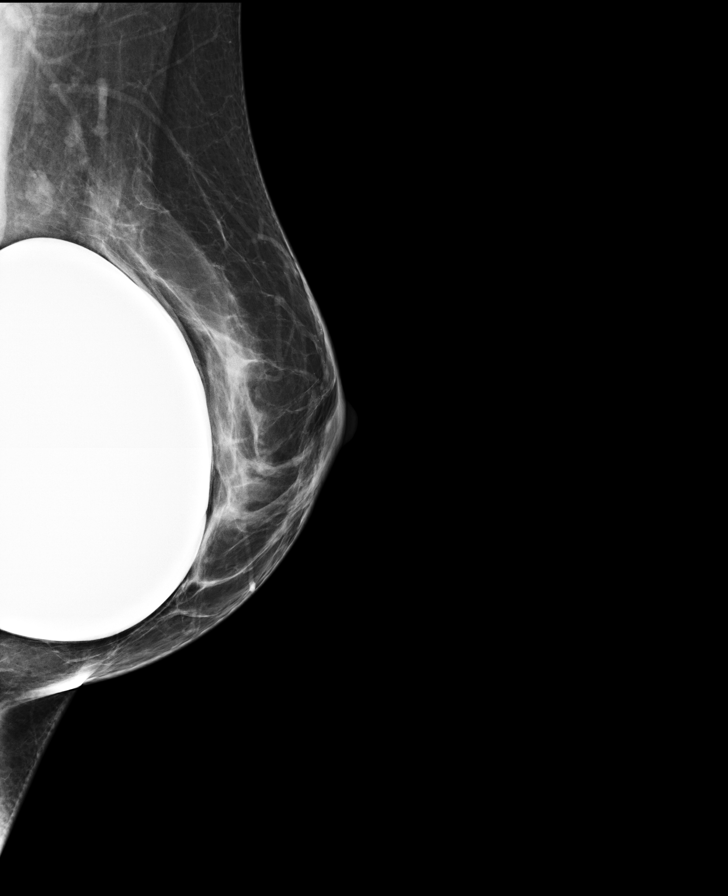

[L CC]
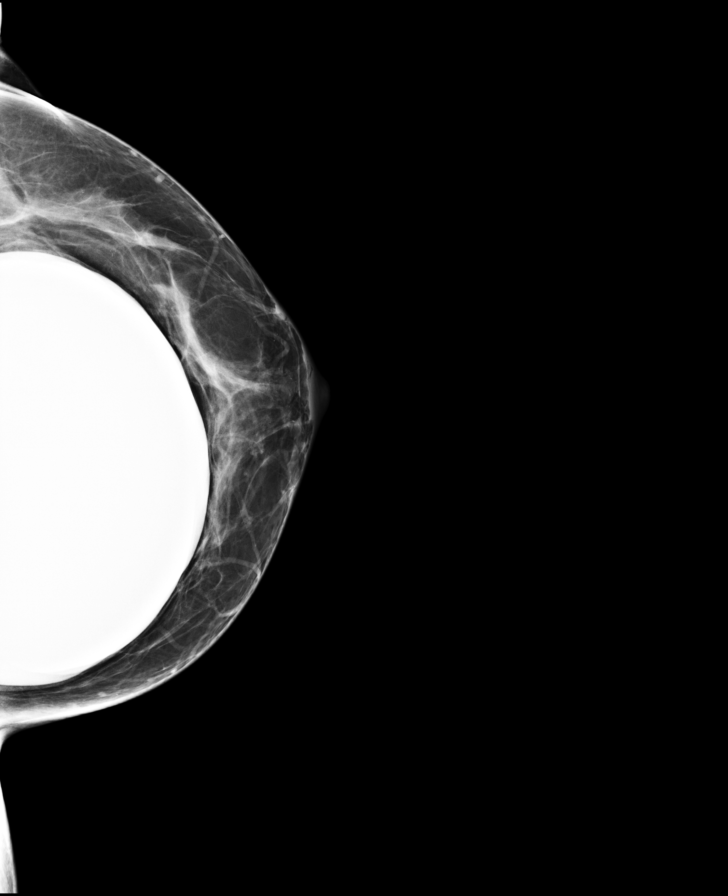

[R MLO]
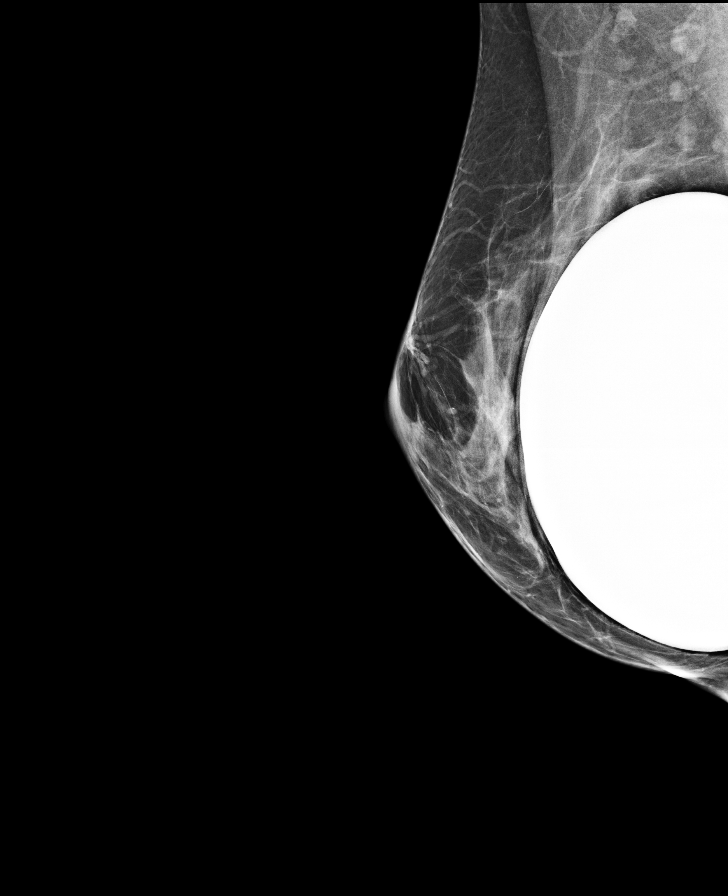

[L CC synth-2D]
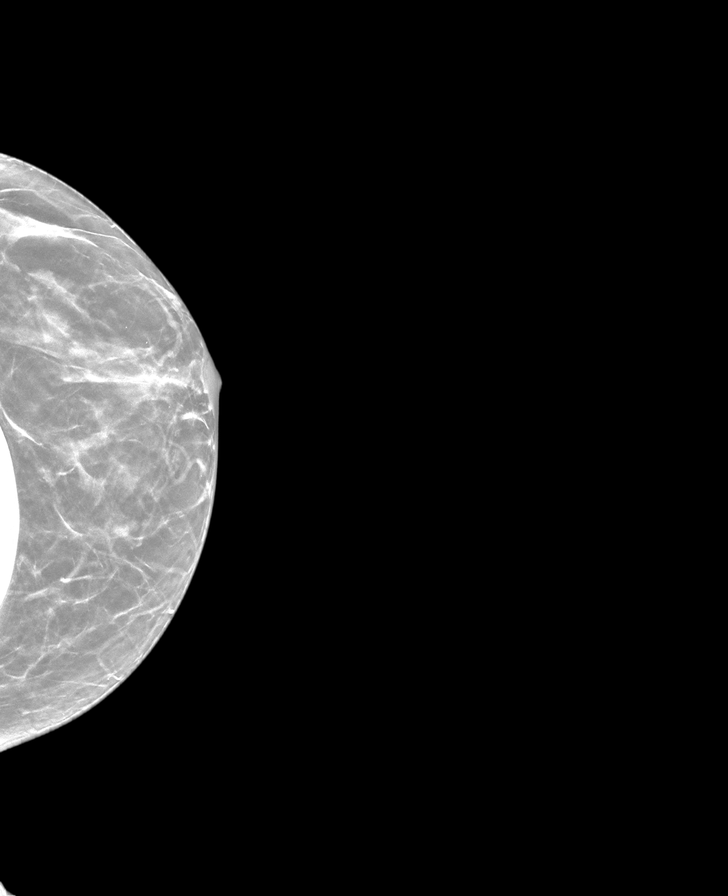

[R CC synth-2D]
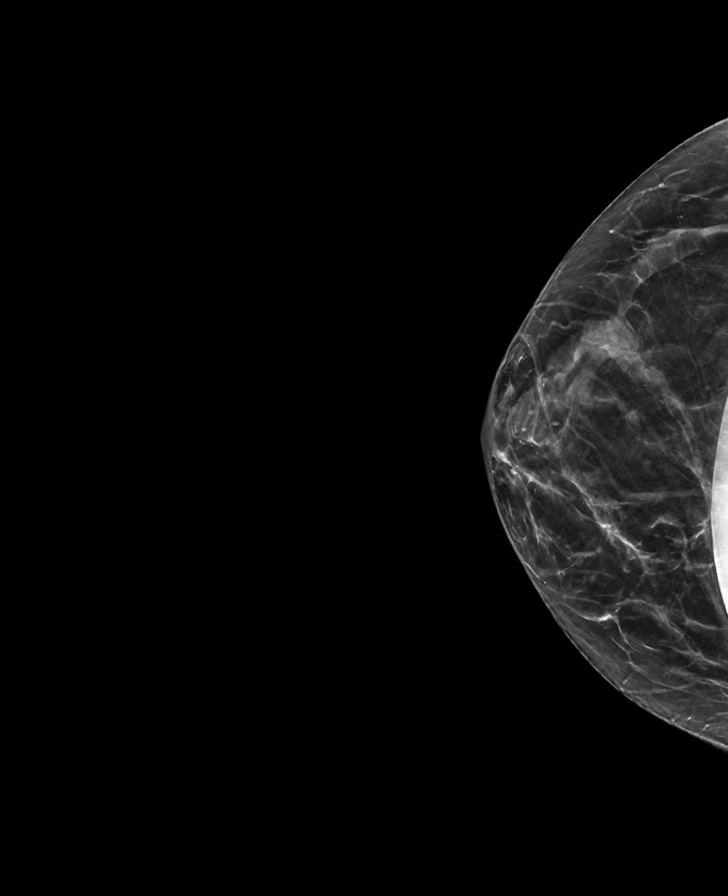

[R MLO synth-2D]
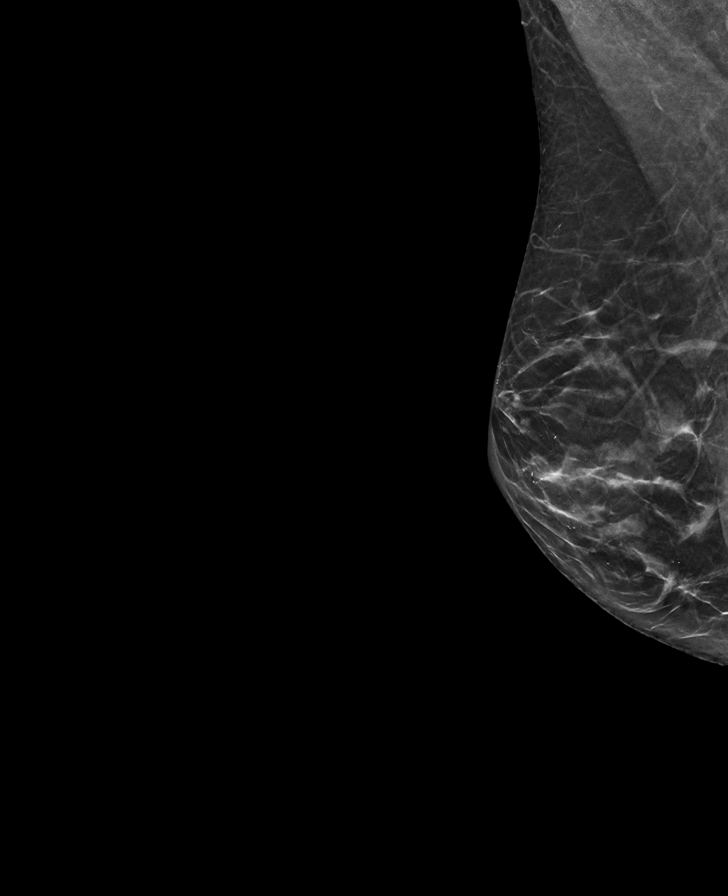

[L MLO synth-2D]
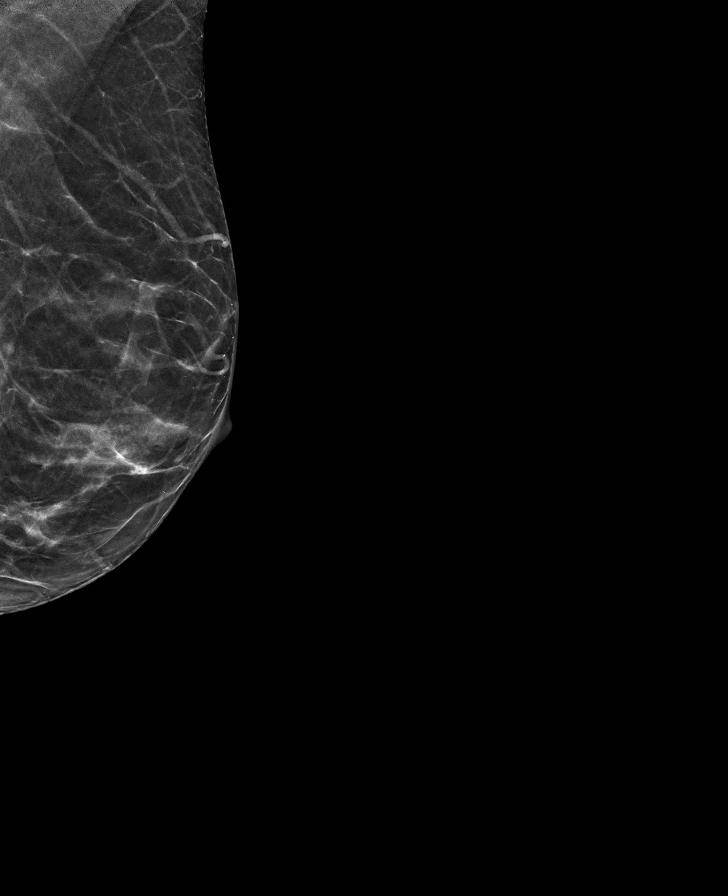

[8 of 28 positions shown; findings below may reference images not displayed]

ACR Breast Density Category c: The breast tissue is heterogeneously
dense, which may obscure small masses.
FINDINGS: The patient has retropectoral implants. There are no findings
suspicious for malignancy.
IMPRESSION: No mammographic evidence of malignancy. A result letter of this
screening mammogram will be mailed directly to the patient.

RECOMMENDATION:
Screening mammogram in one year. (Code:LT-E-7TH)

BI-RADS CATEGORY  1:  Negative.
